# Patient Record
Sex: Female | Born: 1975
Health system: Southern US, Community
[De-identification: ages and names within clinical notes are randomized; demographics above are authoritative.]

## PROBLEM LIST (undated history)

## (undated) DIAGNOSIS — A64 Unspecified sexually transmitted disease: Secondary | ICD-10-CM

## (undated) DIAGNOSIS — F419 Anxiety disorder, unspecified: Secondary | ICD-10-CM

## (undated) DIAGNOSIS — Z8669 Personal history of other diseases of the nervous system and sense organs: Secondary | ICD-10-CM

## (undated) DIAGNOSIS — T7840XA Allergy, unspecified, initial encounter: Secondary | ICD-10-CM

## (undated) DIAGNOSIS — B009 Herpesviral infection, unspecified: Secondary | ICD-10-CM

## (undated) DIAGNOSIS — K5900 Constipation, unspecified: Secondary | ICD-10-CM

## (undated) DIAGNOSIS — L709 Acne, unspecified: Secondary | ICD-10-CM

## (undated) DIAGNOSIS — M1990 Unspecified osteoarthritis, unspecified site: Secondary | ICD-10-CM

## (undated) DIAGNOSIS — R1110 Vomiting, unspecified: Secondary | ICD-10-CM

## (undated) HISTORY — DX: Acne, unspecified: L70.9

## (undated) HISTORY — DX: Anxiety disorder, unspecified: F41.9

## (undated) HISTORY — DX: Herpesviral infection, unspecified: B00.9

## (undated) HISTORY — DX: Constipation, unspecified: K59.00

## (undated) HISTORY — PX: BREAST BIOPSY: SHX20

## (undated) HISTORY — PX: BUNIONECTOMY: SHX129

## (undated) HISTORY — PX: UMBILICAL HERNIA REPAIR: SHX196

## (undated) HISTORY — DX: Personal history of other diseases of the nervous system and sense organs: Z86.69

## (undated) HISTORY — DX: Vomiting, unspecified: R11.10

## (undated) HISTORY — DX: Allergy, unspecified, initial encounter: T78.40XA

## (undated) HISTORY — PX: OTHER SURGICAL HISTORY: SHX169

## (undated) HISTORY — DX: Unspecified osteoarthritis, unspecified site: M19.90

## (undated) HISTORY — DX: Unspecified sexually transmitted disease: A64

---

## 2010-01-11 ENCOUNTER — Ambulatory Visit: Payer: Self-pay | Admitting: Vascular Surgery

## 2010-01-11 ENCOUNTER — Ambulatory Visit (HOSPITAL_COMMUNITY): Admission: RE | Admit: 2010-01-11 | Discharge: 2010-01-11 | Payer: Self-pay | Admitting: Internal Medicine

## 2012-12-03 ENCOUNTER — Ambulatory Visit: Payer: Self-pay | Admitting: Family

## 2012-12-12 ENCOUNTER — Encounter: Payer: Self-pay | Admitting: Family

## 2012-12-12 ENCOUNTER — Ambulatory Visit (INDEPENDENT_AMBULATORY_CARE_PROVIDER_SITE_OTHER): Payer: PRIVATE HEALTH INSURANCE | Admitting: Family

## 2012-12-12 VITALS — BP 104/60 | HR 87 | Ht 67.5 in | Wt 188.0 lb

## 2012-12-12 DIAGNOSIS — Z23 Encounter for immunization: Secondary | ICD-10-CM

## 2012-12-12 DIAGNOSIS — G43909 Migraine, unspecified, not intractable, without status migrainosus: Secondary | ICD-10-CM

## 2012-12-12 MED ORDER — AMITRIPTYLINE HCL 10 MG PO TABS: 10.0000 mg | ORAL_TABLET | Freq: Every day | ORAL | Status: DC

## 2012-12-12 MED ORDER — SUMATRIPTAN SUCCINATE 100 MG PO TABS: 100.0000 mg | ORAL_TABLET | ORAL | Status: DC | PRN

## 2012-12-12 NOTE — Patient Instructions (Signed)
Migraine Headache A migraine headache is an intense, throbbing pain on one or both sides of your head. A migraine can last for 30 minutes to several hours. CAUSES  The exact cause of a migraine headache is not always known. However, a migraine may be caused when nerves in the brain become irritated and release chemicals that cause inflammation. This causes pain. SYMPTOMS  Pain on one or both sides of your head.  Pulsating or throbbing pain.  Severe pain that prevents daily activities.  Pain that is aggravated by any physical activity.  Nausea, vomiting, or both.  Dizziness.  Pain with exposure to bright lights, loud noises, or activity.  General sensitivity to bright lights, loud noises, or smells. Before you get a migraine, you may get warning signs that a migraine is coming (aura). An aura may include:  Seeing flashing lights.  Seeing bright spots, halos, or zig-zag lines.  Having tunnel vision or blurred vision.  Having feelings of numbness or tingling.  Having trouble talking.  Having muscle weakness. MIGRAINE TRIGGERS  Alcohol.  Smoking.  Stress.  Menstruation.  Aged cheeses.  Foods or drinks that contain nitrates, glutamate, aspartame, or tyramine.  Lack of sleep.  Chocolate.  Caffeine.  Hunger.  Physical exertion.  Fatigue.  Medicines used to treat chest pain (nitroglycerine), birth control pills, estrogen, and some blood pressure medicines. DIAGNOSIS  A migraine headache is often diagnosed based on:  Symptoms.  Physical examination.  A CT scan or MRI of your head. TREATMENT Medicines may be given for pain and nausea. Medicines can also be given to help prevent recurrent migraines.  HOME CARE INSTRUCTIONS  Only take over-the-counter or prescription medicines for pain or discomfort as directed by your caregiver. The use of long-term narcotics is not recommended.  Lie down in a dark, quiet room when you have a migraine.  Keep a journal  to find out what may trigger your migraine headaches. For example, write down:  What you eat and drink.  How much sleep you get.  Any change to your diet or medicines.  Limit alcohol consumption.  Quit smoking if you smoke.  Get 7 to 9 hours of sleep, or as recommended by your caregiver.  Limit stress.  Keep lights dim if bright lights bother you and make your migraines worse. SEEK IMMEDIATE MEDICAL CARE IF:   Your migraine becomes severe.  You have a fever.  You have a stiff neck.  You have vision loss.  You have muscular weakness or loss of muscle control.  You start losing your balance or have trouble walking.  You feel faint or pass out.  You have severe symptoms that are different from your first symptoms. MAKE SURE YOU:   Understand these instructions.  Will watch your condition.  Will get help right away if you are not doing well or get worse. Document Released: 03/13/2005 Document Revised: 06/05/2011 Document Reviewed: 03/03/2011 [employer] Patient Information 2014 [employer].  

## 2012-12-13 ENCOUNTER — Encounter: Payer: Self-pay | Admitting: Family

## 2012-12-13 DIAGNOSIS — G43909 Migraine, unspecified, not intractable, without status migrainosus: Secondary | ICD-10-CM | POA: Insufficient documentation

## 2012-12-13 NOTE — Progress Notes (Signed)
Subjective:    Patient ID: Jennifer Cardenas, female    DOB: 1975/07/01, 37 y.o.   MRN: [id number]  HPI  37 year old African American female, nonsmoker, the patient to the practice is in today with complaints of migraine headaches that typically occur around her menstrual cycle. Reports about 7 days of an actual migraine headache they're often debilitating in keeping her in bed. She takes Aleve then dulls the pain but says not completely free of the pain. Has nausea and vomiting associated with the headaches. At times, she has photophobia and phonophobia. Reports the headache being unilateral, one side or the other, one behind her ear down into her neck and shoulder. In the past, she has tried Maxalt and Fioricet that works sometimes.  Review of Systems  Constitutional: Negative.   HENT: Negative for congestion, rhinorrhea and sneezing.   Respiratory: Negative.   Cardiovascular: Negative.   Gastrointestinal: Negative.   Endocrine: Negative.   Genitourinary: Negative.   Musculoskeletal: Negative.   Skin: Negative.   Neurological: Positive for headaches. Negative for tremors and weakness.  Hematological: Negative.   Psychiatric/Behavioral: Negative.    Past Medical History  Diagnosis Date  . Herpes simplex     History   Social History  . Marital Status: Legally Separated    Spouse Name: N/A    Number of Children: N/A  . Years of Education: N/A   Occupational History  . Not on file.   Social History Main Topics  . Smoking status: Never Smoker   . Smokeless tobacco: Not on file  . Alcohol Use: No  . Drug Use: No  . Sexual Activity: Not on file   Other Topics Concern  . Not on file   Social History Narrative  . No narrative on file    Past Surgical History  Procedure Laterality Date  . Bunionectomy      1996 and 1998    Family History  Problem Relation Age of Onset  . Bipolar disorder Mother   . Hepatitis C Mother   . Hyperlipidemia Father     Allergies   Allergen Reactions  . Acne [Benzoyl Peroxide]     No current outpatient prescriptions on file prior to visit.   No current facility-administered medications on file prior to visit.    BP 104/60  Pulse 87  Ht 5' 7.5" (1.715 m)  Wt 188 lb (85.276 kg)  BMI 28.99 kg/m2chart    Objective:   Physical Exam  Constitutional: She is oriented to person, place, and time. She appears well-developed and well-nourished.  HENT:  Head: Normocephalic.  Right Ear: External ear normal.  Left Ear: External ear normal.  Nose: Nose normal.  Mouth/Throat: Oropharynx is clear and moist.  Eyes: Conjunctivae and EOM are normal. Pupils are equal, round, and reactive to light.  Neck: Normal range of motion. Neck supple. No thyromegaly present.  Cardiovascular: Normal rate, regular rhythm and normal heart sounds.   Pulmonary/Chest: Effort normal and breath sounds normal.  Abdominal: Soft. Bowel sounds are normal.  Musculoskeletal: Normal range of motion.  Neurological: She is alert and oriented to person, place, and time. She has normal reflexes. She displays normal reflexes. No cranial nerve deficit. Coordination normal.  Skin: Skin is warm and dry.  Psychiatric: She has a normal mood and affect.          Assessment & Plan:  Assessment: 1. Migraine headache  Plan: Amitriptyline 10 mg once daily at bedtime for migraine prevention. Imitrex 100 mg to be taken at  the onset of the headache may be repeated 2 hours later. Decrease caffeine intake. Exercise daily. Return for complete physical exam soon as possible. Call with any questions prior to then.

## 2012-12-17 ENCOUNTER — Other Ambulatory Visit (INDEPENDENT_AMBULATORY_CARE_PROVIDER_SITE_OTHER): Payer: PRIVATE HEALTH INSURANCE

## 2012-12-17 ENCOUNTER — Other Ambulatory Visit: Payer: PRIVATE HEALTH INSURANCE

## 2012-12-17 DIAGNOSIS — Z0000 Encounter for general adult medical examination without abnormal findings: Secondary | ICD-10-CM

## 2012-12-17 LAB — BASIC METABOLIC PANEL
BUN: 7 mg/dL (ref 6–23)
Calcium: 9.2 mg/dL (ref 8.4–10.5)
GFR: 108.85 mL/min (ref >60.00)
Glucose, Bld: 90 mg/dL (ref 70–99)

## 2012-12-17 LAB — LIPID PANEL
Cholesterol: 166 mg/dL (ref 0–200)
HDL: 44.7 mg/dL (ref >39.00)
Total CHOL/HDL Ratio: 4
Triglycerides: 80 mg/dL (ref 0.0–149.0)

## 2012-12-17 LAB — CBC WITH DIFFERENTIAL/PLATELET
Basophils Absolute: 0 10*3/uL (ref 0.0–0.1)
Eosinophils Absolute: 0 10*3/uL (ref 0.0–0.7)
Lymphocytes Relative: 19.6 % (ref 12.0–46.0)
Lymphs Abs: 1.6 10*3/uL (ref 0.7–4.0)
Monocytes Relative: 8.8 % (ref 3.0–12.0)
Platelets: 229 10*3/uL (ref 150.0–400.0)
RDW: 13.9 % (ref 11.5–14.6)

## 2012-12-17 LAB — POCT URINALYSIS DIPSTICK
Bilirubin, UA: NEGATIVE
Glucose, UA: NEGATIVE
Nitrite, UA: NEGATIVE

## 2012-12-17 LAB — TSH: TSH: 0.77 u[IU]/mL (ref 0.35–5.50)

## 2012-12-17 LAB — HEPATIC FUNCTION PANEL
AST: 16 U/L (ref 0–37)
Alkaline Phosphatase: 38 U/L — ABNORMAL LOW (ref 39–117)
Total Bilirubin: 0.6 mg/dL (ref 0.3–1.2)

## 2012-12-26 ENCOUNTER — Ambulatory Visit (INDEPENDENT_AMBULATORY_CARE_PROVIDER_SITE_OTHER): Payer: PRIVATE HEALTH INSURANCE | Admitting: Family

## 2012-12-26 ENCOUNTER — Encounter: Payer: Self-pay | Admitting: Family

## 2012-12-26 VITALS — BP 108/80 | HR 98 | Wt 192.0 lb

## 2012-12-26 DIAGNOSIS — Z1231 Encounter for screening mammogram for malignant neoplasm of breast: Secondary | ICD-10-CM

## 2012-12-26 DIAGNOSIS — Z803 Family history of malignant neoplasm of breast: Secondary | ICD-10-CM

## 2012-12-26 DIAGNOSIS — B009 Herpesviral infection, unspecified: Secondary | ICD-10-CM

## 2012-12-26 DIAGNOSIS — Z0000 Encounter for general adult medical examination without abnormal findings: Secondary | ICD-10-CM

## 2012-12-26 MED ORDER — VALACYCLOVIR HCL 1 G PO TABS: 1000.0000 mg | ORAL_TABLET | Freq: Two times a day (BID) | ORAL | Status: DC

## 2012-12-26 NOTE — Progress Notes (Signed)
Subjective:    Patient ID: Jennifer Cardenas, female    DOB: May 07, 1975, 37 y.o.   MRN: [id number]  HPI 37 year old AAF, in in for a routine physical examination for this healthy  Female. Reviewed all health maintenance protocols including mammography colonoscopy bone density and reviewed appropriate screening labs. Her immunization history was reviewed as well as her current medications and allergies refills of her chronic medications were given and the plan for yearly health maintenance was discussed all orders and referrals were made as appropriate.  Review of Systems  Constitutional: Negative.   HENT: Negative.   Eyes: Negative.   Respiratory: Negative.   Cardiovascular: Negative.   Gastrointestinal: Negative.   Endocrine: Negative.   Genitourinary: Negative.   Musculoskeletal: Negative.   Skin: Negative.   Allergic/Immunologic: Negative.   Neurological: Negative.   Hematological: Negative.   Psychiatric/Behavioral: Negative.    Past Medical History  Diagnosis Date  . Herpes simplex     History   Social History  . Marital Status: Legally Separated    Spouse Name: N/A    Number of Children: N/A  . Years of Education: N/A   Occupational History  . Not on file.   Social History Main Topics  . Smoking status: Never Smoker   . Smokeless tobacco: Not on file  . Alcohol Use: No  . Drug Use: No  . Sexual Activity: Not on file   Other Topics Concern  . Not on file   Social History Narrative  . No narrative on file    Past Surgical History  Procedure Laterality Date  . Bunionectomy      1996 and 1998    Family History  Problem Relation Age of Onset  . Bipolar disorder Mother   . Hepatitis C Mother   . Hyperlipidemia Father     Allergies  Allergen Reactions  . Acne [Benzoyl Peroxide]     Current Outpatient Prescriptions on File Prior to Visit  Medication Sig Dispense Refill  . amitriptyline (ELAVIL) 10 MG tablet Take 1 tablet (10 mg total) by mouth at  bedtime.  30 tablet  3  . SUMAtriptan (IMITREX) 100 MG tablet Take 1 tablet (100 mg total) by mouth every 2 (two) hours as needed for migraine. May repeat in 2 hours if headache persists or recurs.  10 tablet  3   No current facility-administered medications on file prior to visit.    BP 108/80  Pulse 98  Wt 192 lb (87.091 kg)  BMI 29.61 kg/m2chart    Objective:   Physical Exam  Constitutional: She is oriented to person, place, and time. She appears well-developed and well-nourished.  HENT:  Head: Normocephalic.  Right Ear: External ear normal.  Mouth/Throat: Oropharynx is clear and moist.  Eyes: Conjunctivae and EOM are normal. Pupils are equal, round, and reactive to light.  Neck: Normal range of motion. Neck supple. No thyromegaly present.  Cardiovascular: Normal rate, regular rhythm and normal heart sounds.   Pulmonary/Chest: Effort normal and breath sounds normal.  Abdominal: Soft. Bowel sounds are normal.  Musculoskeletal: Normal range of motion.  Neurological: She is alert and oriented to person, place, and time. She has normal reflexes. She displays normal reflexes. No cranial nerve deficit. Coordination normal.  Skin: Skin is warm and dry.  Psychiatric: She has a normal mood and affect.          Assessment & Plan:  Assessment: 1. CPX 2. Migraine Headache 3. Herpes Simplex  Plan: Continue current medications. Renew  Valtrex today. Encouraged healthy diet, exercise, monthly self breast exams. Due to her family history, mammogram ordered today. Will followup with patient in one year and sooner as needed. Refer to GYN for IUD insertion

## 2012-12-26 NOTE — Patient Instructions (Addendum)
Exercise to Stay Healthy Exercise helps you become and stay healthy. EXERCISE IDEAS AND TIPS Choose exercises that:  You enjoy.  Fit into your day. You do not need to exercise really hard to be healthy. You can do exercises at a slow or medium level and stay healthy. You can:  Stretch before and after working out.  Try yoga, Pilates, or tai chi.  Lift weights.  Walk fast, swim, jog, run, climb stairs, bicycle, dance, or rollerskate.  Take aerobic classes. Exercises that burn about 150 calories:  Running 1  miles in 15 minutes.  Playing volleyball for 45 to 60 minutes.  Washing and waxing a car for 45 to 60 minutes.  Playing touch football for 45 minutes.  Walking 1  miles in 35 minutes.  Pushing a stroller 1  miles in 30 minutes.  Playing basketball for 30 minutes.  Raking leaves for 30 minutes.  Bicycling 5 miles in 30 minutes.  Walking 2 miles in 30 minutes.  Dancing for 30 minutes.  Shoveling snow for 15 minutes.  Swimming laps for 20 minutes.  Walking up stairs for 15 minutes.  Bicycling 4 miles in 15 minutes.  Gardening for 30 to 45 minutes.  Jumping rope for 15 minutes.  Washing windows or floors for 45 to 60 minutes. Document Released: 04/15/2010 Document Revised: 06/05/2011 Document Reviewed: 04/15/2010 [employer] Patient Information 2014 [employer].  

## 2012-12-30 ENCOUNTER — Telehealth: Payer: Self-pay | Admitting: Gynecology

## 2012-12-30 NOTE — Telephone Encounter (Signed)
Called and left message for patient to call me about a doctor referral.

## 2012-12-31 ENCOUNTER — Telehealth: Payer: Self-pay | Admitting: Family

## 2012-12-31 NOTE — Telephone Encounter (Signed)
Spoke with pt

## 2012-12-31 NOTE — Telephone Encounter (Signed)
Pt returning your call

## 2013-01-01 ENCOUNTER — Encounter: Payer: Self-pay | Admitting: Obstetrics and Gynecology

## 2013-01-01 ENCOUNTER — Ambulatory Visit (INDEPENDENT_AMBULATORY_CARE_PROVIDER_SITE_OTHER): Payer: PRIVATE HEALTH INSURANCE | Admitting: Obstetrics and Gynecology

## 2013-01-01 VITALS — BP 122/78 | HR 82 | Resp 18 | Ht 68.5 in | Wt 191.0 lb

## 2013-01-01 DIAGNOSIS — Z0000 Encounter for general adult medical examination without abnormal findings: Secondary | ICD-10-CM

## 2013-01-01 DIAGNOSIS — Z3009 Encounter for other general counseling and advice on contraception: Secondary | ICD-10-CM

## 2013-01-01 DIAGNOSIS — Z01419 Encounter for gynecological examination (general) (routine) without abnormal findings: Secondary | ICD-10-CM

## 2013-01-01 NOTE — Progress Notes (Signed)
GYNECOLOGY VISIT  PCP: Maylon Peppers, FNP ( [facility])  Referring provider: [facility]   HPI: 37 y.o.  Divorced  [place]  female   [phone] with Patient's last menstrual period was 11/08/2012.   here for NGYN.Annual Exam, Discuss removal of Implanon and other forms of contraception  Has migraines around menstrual time, without aura.   Taking amitriptyline to control headaches, but makes her sleepy so cannot take it when she works. Will have difficulty taking a pill every day due to work. Used OCPS in past and had menstrual headaches, but this was years ago. Patient uncertain if pills had estrogen in her menstrual week. Also wants to improve acne.   Hgb:  pcp Urine:  pcp  GYNECOLOGIC HISTORY: Patient's last menstrual period was 11/08/2012. Sexually active: no, not for 2 - 3 years.  Partner preference: female Contraception:   Implanon due to be removed 11/2012 Menopausal hormone therapy: none  DES exposure:   no Blood transfusions:   no Sexually transmitted diseases:    GYN Procedures:  Colposcopy did not do a biopsy.  No treatment.  Mammogram:      never           Pap:   2012 neg History of abnormal pap smear:  2000   OB History   Grav Para Term Preterm Abortions TAB SAB Ect Mult Living   2 2 2       2        LIFESTYLE: Exercise:      None listed          Tobacco: No Alcohol: No Drug use:  No  OTHER HEALTH MAINTENANCE: Tetanus/TDap:  2013 Gardisil:  NA Influenza:  September 2014.  Zostavax:  NA  Bone density: NA Colonoscopy:  NA  Cholesterol check:   Family History  Problem Relation Age of Onset  . Bipolar disorder Mother   . Hepatitis C Mother     Patient Active Problem List   Diagnosis Date Noted  . Herpes simplex 12/26/2012  . Migraine headache 12/13/2012   Past Medical History  Diagnosis Date  . Herpes simplex     H/O HSV 1   and on leg  . STD (sexually transmitted disease)     HSV 1  . Hx of migraines   . Acne   .  Hyperemesis     Past Surgical History  Procedure Laterality Date  . Bunionectomy Bilateral     1996 and 1998    ALLERGIES: Acne  Current Outpatient Prescriptions  Medication Sig Dispense Refill  . amitriptyline (ELAVIL) 10 MG tablet Take 1 tablet (10 mg total) by mouth at bedtime.  30 tablet  3  . SUMAtriptan (IMITREX) 100 MG tablet Take 1 tablet (100 mg total) by mouth every 2 (two) hours as needed for migraine. May repeat in 2 hours if headache persists or recurs.  10 tablet  3  . valACYclovir (VALTREX) 1000 MG tablet Take 1 tablet (1,000 mg total) by mouth 2 (two) times daily.  20 tablet  3   No current facility-administered medications for this visit.     ROS:  Pertinent items are noted in HPI.  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   PHYSICAL EXAMINATION:    BP 122/78  Pulse 82  Resp 18  Ht 5' 8.5" (1.74 m)  Wt 191 lb (86.637 kg)  BMI 28.62 kg/m2  LMP 11/08/2012   Wt Readings from Last 3 Encounters:  01/01/13 191 lb (86.637 kg)  12/26/12 192 lb (87.091 kg)  12/12/12 188 lb (85.276 kg)     Ht Readings from Last 3 Encounters:  01/01/13 5' 8.5" (1.74 m)  12/12/12 5' 7.5" (1.715 m)    General appearance: alert, cooperative and appears stated age Head: Normocephalic, without obvious abnormality, atraumatic Neck: no adenopathy, supple, symmetrical, trachea midline and thyroid not enlarged, symmetric, no tenderness/mass/nodules Lungs: clear to auscultation bilaterally Breasts: Inspection negative, No nipple retraction or dimpling, No nipple discharge or bleeding, No axillary or supraclavicular adenopathy, Normal to palpation without dominant masses Heart: regular rate and rhythm Abdomen: soft, non-tender; no masses,  no organomegaly Extremities: extremities normal, atraumatic, no cyanosis or edema Skin: Skin color, texture, turgor normal. No rashes or lesions.  Implanon palpable in the right arm.  Raised small 3 mm dark firm scars of the right arm.    Lymph  nodes: Cervical, supraclavicular, and axillary nodes normal. No abnormal inguinal nodes palpated Neurologic: Grossly normal  Pelvic: External genitalia:  no lesions              Urethra:  normal appearing urethra with no masses, tenderness or lesions              Bartholins and Skenes: normal                 Vagina: normal appearing vagina with normal color and discharge, no lesions              Cervix: normal appearance              Pap and high risk HPV testing done: yes.            Bimanual Exam:  Uterus:  uterus is normal size, shape, consistency and nontender                                      Adnexa: normal adnexa in size, nontender and no masses                                      Rectovaginal: Confirms                                      Anus:  normal sphincter tone, no lesions  ASSESSMENT  Normal gynecologic exam. Implanon patient. History of menstrual migraines.   PLAN  Mammogram as pere PCP orders. Pap smear and high risk HPV testing Counseled on Nuvaring continuous use.  Can initiate when her Implanon comes out.  Return for Implanon removal.   Return annually or prn   An After Visit Summary was printed and given to the patient.

## 2013-01-01 NOTE — Patient Instructions (Signed)
EXERCISE AND DIET:  We recommended that you start or continue a regular exercise program for good health. Regular exercise means any activity that makes your heart beat faster and makes you sweat.  We recommend exercising at least 30 minutes per day at least 3 days a week, preferably 4 or 5.  We also recommend a diet low in fat and sugar.  Inactivity, poor dietary choices and obesity can cause diabetes, heart attack, stroke, and kidney damage, among others.    ALCOHOL AND SMOKING:  Women should limit their alcohol intake to no more than 7 drinks/beers/glasses of wine (combined, not each!) per week. Moderation of alcohol intake to this level decreases your risk of breast cancer and liver damage. And of course, no recreational drugs are part of a healthy lifestyle.  And absolutely no smoking or even second hand smoke. Most people know smoking can cause heart and lung diseases, but did you know it also contributes to weakening of your bones? Aging of your skin?  Yellowing of your teeth and nails?  CALCIUM AND VITAMIN D:  Adequate intake of calcium and Vitamin D are recommended.  The recommendations for exact amounts of these supplements seem to change often, but generally speaking 600 mg of calcium (either carbonate or citrate) and 800 units of Vitamin D per day seems prudent. Certain women may benefit from higher intake of Vitamin D.  If you are among these women, your doctor will have told you during your visit.    PAP SMEARS:  Pap smears, to check for cervical cancer or precancers,  have traditionally been done yearly, although recent scientific advances have shown that most women can have pap smears less often.  However, every woman still should have a physical exam from her gynecologist every year. It will include a breast check, inspection of the vulva and vagina to check for abnormal growths or skin changes, a visual exam of the cervix, and then an exam to evaluate the size and shape of the uterus and  ovaries.  And after 40 years of age, a rectal exam is indicated to check for rectal cancers. We will also provide age appropriate advice regarding health maintenance, like when you should have certain vaccines, screening for sexually transmitted diseases, bone density testing, colonoscopy, mammograms, etc.   MAMMOGRAMS:  All women over 40 years old should have a yearly mammogram. Many facilities now offer a "3D" mammogram, which may cost around $50 extra out of pocket. If possible,  we recommend you accept the option to have the 3D mammogram performed.  It both reduces the number of women who will be called back for extra views which then turn out to be normal, and it is better than the routine mammogram at detecting truly abnormal areas.    COLONOSCOPY:  Colonoscopy to screen for colon cancer is recommended for all women at age 50.  We know, you hate the idea of the prep.  We agree, BUT, having colon cancer and not knowing it is worse!!  Colon cancer so often starts as a polyp that can be seen and removed at colonscopy, which can quite literally save your life!  And if your first colonoscopy is normal and you have no family history of colon cancer, most women don't have to have it again for 10 years.  Once every ten years, you can do something that may end up saving your life, right?  We will be happy to help you get it scheduled when you are ready.    Be sure to check your insurance coverage so you understand how much it will cost.  It may be covered as a preventative service at no cost, but you should check your particular policy.     Ethinyl Estradiol; Etonogestrel vaginal ring What is this medicine? ETHINYL ESTRADIOL; ETONOGESTREL (ETH in il es tra DYE ole; et oh noe JES trel) vaginal ring is a flexible, vaginal ring used as a contraceptive (birth control method). This medicine combines two types of female hormones, an estrogen and a progestin. This ring is used to prevent ovulation and pregnancy. Each  ring is effective for one month. This medicine may be used for other purposes; ask your health care provider or pharmacist if you have questions. What should I tell my health care provider before I take this medicine? They need to know if you have or ever had any of these conditions: -abnormal vaginal bleeding -blood vessel disease or blood clots -breast, cervical, endometrial, ovarian, liver, or uterine cancer -diabetes -gallbladder disease -heart disease or recent heart attack -high blood pressure -high cholesterol -kidney disease -liver disease -migraine headaches -stroke -systemic lupus erythematosus (SLE) -tobacco smoker -an unusual or allergic reaction to estrogens, progestins, other medicines, foods, dyes, or preservatives -pregnant or trying to get pregnant -breast-feeding How should I use this medicine? Insert the ring into your vagina as directed. Follow the directions on the prescription label. The ring will remain place for 3 weeks and is then removed for a 1-week break. A new ring is inserted 1 week after the last ring was removed, on the same day of the week. Do not use more often than directed. A patient package insert for the product will be given with each prescription and refill. Read this sheet carefully each time. The sheet may change frequently. Contact your pediatrician regarding the use of this medicine in children. Special care may be needed. This medicine has been used in female children who have started having menstrual periods. Overdosage: If you think you have taken too much of this medicine contact a poison control center or emergency room at once. NOTE: This medicine is only for you. Do not share this medicine with others. What if I miss a dose? You will need to replace your vaginal ring once a month as directed. If the ring should slip out, or if you leave it in longer or shorter than you should, contact your health care professional for advice. What may  interact with this medicine? -acetaminophen -antibiotics or medicines for infections, especially rifampin, rifabutin, rifapentine, and griseofulvin, and possibly penicillins or tetracyclines -aprepitant -ascorbic acid (vitamin C) -atorvastatin -barbiturate medicines, such as phenobarbital -bosentan -carbamazepine -caffeine -clofibrate -cyclosporine -dantrolene -doxercalciferol -felbamate -grapefruit juice -hydrocortisone -medicines for anxiety or sleeping problems, such as diazepam or temazepam -medicines for diabetes, including pioglitazone -modafinil -mycophenolate -nefazodone -oxcarbazepine -phenytoin -prednisolone -ritonavir or other medicines for HIV infection or AIDS -rosuvastatin -selegiline -soy isoflavones supplements -St. John's wort -tamoxifen or raloxifene -theophylline -thyroid hormones -topiramate -warfarin This list may not describe all possible interactions. Give your health care provider a list of all the medicines, herbs, non-prescription drugs, or dietary supplements you use. Also tell them if you smoke, drink alcohol, or use illegal drugs. Some items may interact with your medicine. What should I watch for while using this medicine? Visit your doctor or health care professional for regular checks on your progress. You will need a regular breast and pelvic exam and Pap smear while on this medicine. Use an additional method of contraception   during the first cycle that you use this ring. If you have any reason to think you are pregnant, stop using this medicine right away and contact your doctor or health care professional. If you are using this medicine for hormone related problems, it may take several cycles of use to see improvement in your condition. Smoking increases the risk of getting a blood clot or having a stroke while you are using hormonal birth control, especially if you are more than 39 years old. You are strongly advised not to smoke. This  medicine can make your body retain fluid, making your fingers, hands, or ankles swell. Your blood pressure can go up. Contact your doctor or health care professional if you feel you are retaining fluid. This medicine can make you more sensitive to the sun. Keep out of the sun. If you cannot avoid being in the sun, wear protective clothing and use sunscreen. Do not use sun lamps or tanning beds/booths. If you wear contact lenses and notice visual changes, or if the lenses begin to feel uncomfortable, consult your eye care specialist. In some women, tenderness, swelling, or minor bleeding of the gums may occur. Notify your dentist if this happens. Brushing and flossing your teeth regularly may help limit this. See your dentist regularly and inform your dentist of the medicines you are taking. If you are going to have elective surgery, you may need to stop using this medicine before the surgery. Consult your health care professional for advice. This medicine does not protect you against HIV infection (AIDS) or any other sexually transmitted diseases. What side effects may I notice from receiving this medicine? Side effects that you should report to your doctor or health care professional as soon as possible: -breast tissue changes or discharge -changes in vaginal bleeding during your period or between your periods -chest pain -coughing up blood -dizziness or fainting spells -headaches or migraines -leg, arm or groin pain -severe or sudden headaches -stomach pain (severe) -sudden shortness of breath -sudden loss of coordination, especially on one side of the body -speech problems -symptoms of vaginal infection like itching, irritation or unusual discharge -tenderness in the upper abdomen -vomiting -weakness or numbness in the arms or legs, especially on one side of the body -yellowing of the eyes or skin Side effects that usually do not require medical attention (report to your doctor or health  care professional if they continue or are bothersome): -breakthrough bleeding and spotting that continues beyond the 3 initial cycles of pills -breast tenderness -mood changes, anxiety, depression, frustration, anger, or emotional outbursts -increased sensitivity to sun or ultraviolet light -nausea -skin rash, acne, or brown spots on the skin -weight gain (slight) This list may not describe all possible side effects. Call your doctor for medical advice about side effects. You may report side effects to FDA at 1-800-FDA-1088. Where should I keep my medicine? Keep out of the reach of children. Store at room temperature between 15 and 30 degrees C (59 and 86 degrees F) for up to 4 months. The product will expire after 4 months. Protect from light. Throw away any unused medicine after the expiration date. NOTE: This sheet is a summary. It may not cover all possible information. If you have questions about this medicine, talk to your doctor, pharmacist, or health care provider.  2013, [employer]. (02/27/2008 12:03:58 PM)

## 2013-01-02 NOTE — Addendum Note (Signed)
Addended by: Conley Simmonds on: 01/02/2013 07:17 AM   Modules accepted: Orders

## 2013-01-07 ENCOUNTER — Telehealth: Payer: Self-pay | Admitting: Obstetrics and Gynecology

## 2013-01-07 NOTE — Telephone Encounter (Signed)
LMTCB when ready to schedule Nexplanon removal. Advised we received a quote from [employer] that it will be covered 100%, advised this is NOT a guarantee of coverage or payment and to call to schedule.

## 2013-01-14 NOTE — Telephone Encounter (Signed)
Patient has implanon. Requesting dc.  Message left to return call to Lazear at [phone].

## 2013-01-14 NOTE — Telephone Encounter (Signed)
Patient requesting removal of implanon.  Appointment scheduled for 11/3 at 1130.  Routing to Dr. Edward Jolly for FYI. Will close encounter.

## 2013-01-14 NOTE — Telephone Encounter (Signed)
Pt wants to have her IUD removed. 

## 2013-01-15 ENCOUNTER — Other Ambulatory Visit: Payer: Self-pay | Admitting: Obstetrics and Gynecology

## 2013-01-15 DIAGNOSIS — Z3040 Encounter for surveillance of contraceptives, unspecified: Secondary | ICD-10-CM

## 2013-01-27 ENCOUNTER — Encounter: Payer: Self-pay | Admitting: Obstetrics and Gynecology

## 2013-01-27 ENCOUNTER — Ambulatory Visit (INDEPENDENT_AMBULATORY_CARE_PROVIDER_SITE_OTHER): Payer: PRIVATE HEALTH INSURANCE | Admitting: Obstetrics and Gynecology

## 2013-01-27 VITALS — BP 110/66 | HR 82 | Ht 68.5 in | Wt 196.0 lb

## 2013-01-27 DIAGNOSIS — Z3040 Encounter for surveillance of contraceptives, unspecified: Secondary | ICD-10-CM

## 2013-01-27 DIAGNOSIS — Z3046 Encounter for surveillance of implantable subdermal contraceptive: Secondary | ICD-10-CM

## 2013-01-27 MED ORDER — ETONOGESTREL-ETHINYL ESTRADIOL 0.12-0.015 MG/24HR VA RING: VAGINAL_RING | VAGINAL | Status: DC

## 2013-01-27 NOTE — Patient Instructions (Signed)

## 2013-01-27 NOTE — Progress Notes (Signed)
Patient ID: Jennifer Cardenas, female   DOB: Jul 10, 1975, 37 y.o.   MRN: [id number]  Subjective  Here for Implanon removal. Can have menstrual headaches, not every month.   Has migraines.  No aura.  Not a smoker.  No history of DVT, PE.   Will start working at the [employer] in December.   Objective  Right arm examine and Implanon rod located. Consent for removal.  Sterile prep of skin with betadine.   1% lidocaine local. Incision with scalpel over the entrance site of the Implanon. Grasped with a hemostat and removed without difficulty. Steristrip, benzoin, and bandaid placed. No complications. Minimal EBL.   Assessment  Removal of Implanon. Menstrual migraines, but not consistent every month.   Plan  Post Implanon removal care reviewed with the patient. Start NuvaRing.  Will use continuously for 3 months and then have a cycle.  Reviewed the proper use of NuvaRing.  Follow up prn.

## 2013-02-03 ENCOUNTER — Other Ambulatory Visit: Payer: Self-pay | Admitting: Obstetrics and Gynecology

## 2013-02-03 ENCOUNTER — Encounter: Payer: Self-pay | Admitting: Obstetrics and Gynecology

## 2013-02-07 ENCOUNTER — Ambulatory Visit
Admission: RE | Admit: 2013-02-07 | Discharge: 2013-02-07 | Disposition: A | Discharge status: Home / Self Care | Payer: PRIVATE HEALTH INSURANCE | Source: Ambulatory Visit | Attending: Family | Admitting: Family

## 2013-02-07 DIAGNOSIS — Z1231 Encounter for screening mammogram for malignant neoplasm of breast: Secondary | ICD-10-CM

## 2013-02-07 DIAGNOSIS — Z803 Family history of malignant neoplasm of breast: Secondary | ICD-10-CM

## 2013-02-10 ENCOUNTER — Encounter: Payer: Self-pay | Admitting: Family

## 2013-02-11 ENCOUNTER — Other Ambulatory Visit: Payer: Self-pay

## 2013-02-11 MED ORDER — AMITRIPTYLINE HCL 10 MG PO TABS: 20.0000 mg | ORAL_TABLET | Freq: Every day | ORAL | Status: DC

## 2013-02-11 NOTE — Telephone Encounter (Signed)
Pt still experiencing headaches. Per Padonda, increase Elavil to 20mg  qhs

## 2013-03-14 ENCOUNTER — Telehealth: Payer: Self-pay | Admitting: Family

## 2013-03-14 NOTE — Telephone Encounter (Signed)
Attempted call back x 1. LM on VM to call back .

## 2013-04-14 ENCOUNTER — Encounter: Payer: Self-pay | Admitting: Family

## 2013-04-15 ENCOUNTER — Encounter: Payer: Self-pay | Admitting: Family

## 2013-04-15 ENCOUNTER — Ambulatory Visit (INDEPENDENT_AMBULATORY_CARE_PROVIDER_SITE_OTHER): Payer: 59 | Admitting: Family

## 2013-04-15 VITALS — BP 104/60 | HR 102 | Ht 68.5 in | Wt 196.0 lb

## 2013-04-15 DIAGNOSIS — M25569 Pain in unspecified knee: Secondary | ICD-10-CM

## 2013-04-15 DIAGNOSIS — M25562 Pain in left knee: Secondary | ICD-10-CM

## 2013-04-15 DIAGNOSIS — M7052 Other bursitis of knee, left knee: Secondary | ICD-10-CM

## 2013-04-15 DIAGNOSIS — M76899 Other specified enthesopathies of unspecified lower limb, excluding foot: Secondary | ICD-10-CM

## 2013-04-15 MED ORDER — MELOXICAM 15 MG PO TABS: 15.0000 mg | ORAL_TABLET | Freq: Every day | ORAL | Status: DC

## 2013-04-15 NOTE — Progress Notes (Signed)
Subjective:    Patient ID: Jennifer Cardenas, female    DOB: May 01, 1975, 38 y.o.   MRN: [id number]  HPI 38 year old A[employer], nonsmoker, is in today with c/o left knee pain x 3 weeks. Reports a twist injury. Pain is worse with sitting and standing for an extended period of time. Rates the pain 8-9/10. Has taken Aleve without much relief. She is employed as a Therapist, sports.   Review of Systems  Constitutional: Negative.   Respiratory: Negative.   Cardiovascular: Negative.   Musculoskeletal: Positive for arthralgias.       Left knee pain  Skin: Negative.   Neurological: Negative.        Past Medical History  Diagnosis Date  . Herpes simplex     H/O HSV 1   and on leg  . STD (sexually transmitted disease)     HSV 1  . Hx of migraines   . Acne   . Hyperemesis     History   Social History  . Marital Status: Legally Separated    Spouse Name: N/A    Number of Children: N/A  . Years of Education: N/A   Occupational History  . Not on file.   Social History Main Topics  . Smoking status: Never Smoker   . Smokeless tobacco: Never Used  . Alcohol Use: No  . Drug Use: No  . Sexual Activity: No     Comment: Implanon due to be removed 11/2012   Other Topics Concern  . Not on file   Social History Narrative  . No narrative on file    Past Surgical History  Procedure Laterality Date  . Bunionectomy Bilateral     1996 and 1998  . Excision of right breast mass Right     benign    Family History  Problem Relation Age of Onset  . Bipolar disorder Mother   . Hepatitis C Mother     Allergies  Allergen Reactions  . Acne [Benzoyl Peroxide]     Current Outpatient Prescriptions on File Prior to Visit  Medication Sig Dispense Refill  . amitriptyline (ELAVIL) 10 MG tablet Take 2 tablets (20 mg total) by mouth at bedtime.  60 tablet  0  . etonogestrel-ethinyl estradiol (NUVARING) 0.12-0.015 MG/24HR vaginal ring Insert vaginally and leave in place for 3 consecutive weeks, then remove for 1  week.  3 each  3  . SUMAtriptan (IMITREX) 100 MG tablet Take 1 tablet (100 mg total) by mouth every 2 (two) hours as needed for migraine. May repeat in 2 hours if headache persists or recurs.  10 tablet  3  . valACYclovir (VALTREX) 1000 MG tablet Take 1 tablet (1,000 mg total) by mouth 2 (two) times daily.  20 tablet  3   No current facility-administered medications on file prior to visit.    BP 104/60  Pulse 102  Ht 5' 8.5" (1.74 m)  Wt 196 lb (88.905 kg)  BMI 29.36 kg/m2chart Objective:   Physical Exam  Constitutional: She is oriented to person, place, and time. She appears well-developed and well-nourished.  Cardiovascular: Normal rate, regular rhythm and normal heart sounds.   Pulmonary/Chest: Effort normal and breath sounds normal.  Musculoskeletal: She exhibits edema and tenderness.  Left knee: non tender to palpation. Mild edema. NO crepitus. No calf tenderness.   Neurological: She is alert and oriented to person, place, and time.  Skin: Skin is warm and dry.  Psychiatric: She has a normal mood and affect.  Assessment & Plan:  Assessment:  1. Knee pain-Left 2. Left knee bursitis-Mobic once daily. Ice 15-20 min daily. Call if symptoms worsen or persist. Recheck as needed. Consider joint injection if no better.

## 2013-04-15 NOTE — Patient Instructions (Signed)
Bursitis Bursitis is a swelling and soreness (inflammation) of a fluid-filled sac (bursa) that overlies and protects a joint. It can be caused by injury, overuse of the joint, arthritis or infection. The joints most likely to be affected are the elbows, shoulders, hips and knees. HOME CARE INSTRUCTIONS   Apply ice to the affected area for 15-20 minutes each hour while awake for 2 days. Put the ice in a plastic bag and place a towel between the bag of ice and your skin.  Rest the injured joint as much as possible, but continue to put the joint through a full range of motion, 4 times per day. (The shoulder joint especially becomes rapidly "frozen" if not used.) When the pain lessens, begin normal slow movements and usual activities.  Only take over-the-counter or prescription medicines for pain, discomfort or fever as directed by your caregiver.  Your caregiver may recommend draining the bursa and injecting medicine into the bursa. This may help the healing process.  Follow all instructions for follow-up with your caregiver. This includes any orthopedic referrals, physical therapy and rehabilitation. Any delay in obtaining necessary care could result in a delay or failure of the bursitis to heal and chronic pain. SEEK IMMEDIATE MEDICAL CARE IF:   Your pain increases even during treatment.  You develop an oral temperature above 102 F (38.9 C) and have heat and inflammation over the involved bursa. MAKE SURE YOU:   Understand these instructions.  Will watch your condition.  Will get help right away if you are not doing well or get worse. Document Released: 03/10/2000 Document Revised: 06/05/2011 Document Reviewed: 02/12/2009 Exit[employer] Patient Information 2014 [employer].  

## 2013-04-18 ENCOUNTER — Encounter: Payer: Self-pay | Admitting: Family

## 2013-04-18 ENCOUNTER — Other Ambulatory Visit: Payer: Self-pay | Admitting: Obstetrics and Gynecology

## 2013-04-18 ENCOUNTER — Encounter: Payer: Self-pay | Admitting: Obstetrics and Gynecology

## 2013-04-18 MED ORDER — VALACYCLOVIR HCL 1 G PO TABS: 1000.0000 mg | ORAL_TABLET | Freq: Two times a day (BID) | ORAL | Status: DC

## 2013-04-18 MED ORDER — SUMATRIPTAN SUCCINATE 100 MG PO TABS: 100.0000 mg | ORAL_TABLET | ORAL | Status: DC | PRN

## 2013-04-18 MED ORDER — ETONOGESTREL-ETHINYL ESTRADIOL 0.12-0.015 MG/24HR VA RING: VAGINAL_RING | VAGINAL | Status: DC

## 2013-04-18 MED ORDER — AMITRIPTYLINE HCL 10 MG PO TABS: 20.0000 mg | ORAL_TABLET | Freq: Every day | ORAL | Status: DC

## 2013-05-23 ENCOUNTER — Encounter: Payer: Self-pay | Admitting: Family

## 2013-05-26 ENCOUNTER — Encounter: Payer: Self-pay | Admitting: Family

## 2013-05-26 ENCOUNTER — Ambulatory Visit (INDEPENDENT_AMBULATORY_CARE_PROVIDER_SITE_OTHER): Payer: 59 | Admitting: Family

## 2013-05-26 VITALS — BP 102/82 | Temp 98.7°F | Wt 202.0 lb

## 2013-05-26 DIAGNOSIS — G43909 Migraine, unspecified, not intractable, without status migrainosus: Secondary | ICD-10-CM

## 2013-05-26 DIAGNOSIS — M1712 Unilateral primary osteoarthritis, left knee: Secondary | ICD-10-CM

## 2013-05-26 DIAGNOSIS — M1710 Unilateral primary osteoarthritis, unspecified knee: Secondary | ICD-10-CM

## 2013-05-26 DIAGNOSIS — IMO0002 Reserved for concepts with insufficient information to code with codable children: Secondary | ICD-10-CM

## 2013-05-26 DIAGNOSIS — M1711 Unilateral primary osteoarthritis, right knee: Secondary | ICD-10-CM

## 2013-05-26 MED ORDER — AMITRIPTYLINE HCL 25 MG PO TABS: 25.0000 mg | ORAL_TABLET | Freq: Every day | ORAL | Status: DC

## 2013-05-26 MED ORDER — METHYLPREDNISOLONE ACETATE 40 MG/ML IJ SUSP: 40.0000 mg | Freq: Once | INTRAMUSCULAR | Status: DC

## 2013-05-26 MED ORDER — MELOXICAM 15 MG PO TABS: 15.0000 mg | ORAL_TABLET | Freq: Every day | ORAL | Status: DC

## 2013-05-26 NOTE — Patient Instructions (Signed)
Knee Injection Joint injections are shots. Your caregiver will place a needle into your knee joint. The needle is used to put medicine into the joint. These shots can be used to help treat different painful knee conditions such as osteoarthritis, bursitis, local flare-ups of rheumatoid arthritis, and pseudogout. Anti-inflammatory medicines such as corticosteroids and anesthetics are the most common medicines used for joint and soft tissue injections.  PROCEDURE  The skin over the kneecap will be cleaned with an antiseptic solution.  Your caregiver will inject a small amount of a local anesthetic (a medicine like Novocaine) just under the skin in the area that was cleaned.  After the area becomes numb, a second injection is done. This second injection usually includes an anesthetic and an anti-inflammatory medicine called a steroid or cortisone. The needle is carefully placed in between the kneecap and the knee, and the medicine is injected into the joint space.  After the injection is done, the needle is removed. Your caregiver may place a bandage over the injection site. The whole procedure takes no more than a couple of minutes. BEFORE THE PROCEDURE  Wash all of the skin around the entire knee area. Try to remove any loose, scaling skin. There is no other specific preparation necessary unless advised otherwise by your caregiver. LET YOUR CAREGIVER KNOW ABOUT:   Allergies.  Medications taken including herbs, eye drops, over the counter medications, and creams.  Use of steroids (by mouth or creams).  Possible pregnancy, if applicable.  Previous problems with anesthetics or Novocaine.  History of blood clots (thrombophlebitis).  History of bleeding or blood problems.  Previous surgery.  Other health problems. RISKS AND COMPLICATIONS Side effects from cortisone shots are rare. They include:   Slight bruising of the skin.  Shrinkage of the normal fatty tissue under the skin where  the shot was given.  Increase in pain after the shot.  Infection.  Weakening of tendons or tendon rupture.  Allergic reaction to the medicine.  Diabetics may have a temporary increase in their blood sugar after a shot.  Cortisone can temporarily weaken the immune system. While receiving these shots, you should not get certain vaccines. Also, avoid contact with anyone who has chickenpox or measles. Especially if you have never had these diseases or have not been previously immunized. Your immune system may not be strong enough to fight off the infection while the cortisone is in your system. AFTER THE PROCEDURE   You can go home after the procedure.  You may need to put ice on the joint 15-20 minutes every 3 or 4 hours until the pain goes away.  You may need to put an elastic bandage on the joint. HOME CARE INSTRUCTIONS   Only take over-the-counter or prescription medicines for pain, discomfort, or fever as directed by your caregiver.  You should avoid stressing the joint. Unless advised otherwise, avoid activities that put a lot of pressure on a knee joint, such as:  Jogging.  Bicycling.  Recreational climbing.  Hiking.  Laying down and elevating the leg/knee above the level of your heart can help to minimize swelling. SEEK MEDICAL CARE IF:   You have repeated or worsening swelling.  There is drainage from the puncture area.  You develop red streaking that extends above or below the site where the needle was inserted. SEEK IMMEDIATE MEDICAL CARE IF:   You develop a fever.  You have pain that gets worse even though you are taking pain medicine.  The area is   red and warm, and you have trouble moving the joint. MAKE SURE YOU:   Understand these instructions.  Will watch your condition.  Will get help right away if you are not doing well or get worse. Document Released: 06/04/2006 Document Revised: 06/05/2011 Document Reviewed: 03/01/2007 Exit[employer] Patient  Information 2014 [employer].  

## 2013-05-26 NOTE — Progress Notes (Signed)
Pre visit review using our clinic review tool, if applicable. No additional management support is needed unless otherwise documented below in the visit note. 

## 2013-05-26 NOTE — Progress Notes (Signed)
Subjective:    Patient ID: Jennifer Cardenas, female    DOB: 06/11/1975, 38 y.o.   MRN: [id number]  HPI Comments: 38 year old African American female, nonsmoker comes in today with bilateral knee pain from osteoarthritis. She is requesting a joint injection. The pain is 6/10, worse with walking and standing. She works as an Therapist, sports.   Patient also has a history of migraine headaches and is currently on amitriptyline 20 mg at bedtime that has decreased the intensity and frequency of her headaches. However, still had a headache approximately once a week.       Review of Systems  Constitutional: Negative.   Respiratory: Negative.   Cardiovascular: Negative.   Musculoskeletal: Positive for arthralgias.       Bilateral knee pain  Skin: Negative.   Neurological: Positive for headaches.       Migraine headaches, decreased intensity and frequency  Psychiatric/Behavioral: Negative.    Past Medical History  Diagnosis Date  . Herpes simplex     H/O HSV 1   and on leg  . STD (sexually transmitted disease)     HSV 1  . Hx of migraines   . Acne   . Hyperemesis     History   Social History  . Marital Status: Legally Separated    Spouse Name: N/A    Number of Children: N/A  . Years of Education: N/A   Occupational History  . Not on file.   Social History Main Topics  . Smoking status: Never Smoker   . Smokeless tobacco: Never Used  . Alcohol Use: No  . Drug Use: No  . Sexual Activity: No     Comment: Implanon due to be removed 11/2012   Other Topics Concern  . Not on file   Social History Narrative  . No narrative on file    Past Surgical History  Procedure Laterality Date  . Bunionectomy Bilateral     1996 and 1998  . Excision of right breast mass Right     benign    Family History  Problem Relation Age of Onset  . Bipolar disorder Mother   . Hepatitis C Mother     Allergies  Allergen Reactions  . Acne [Benzoyl Peroxide]     Current Outpatient Prescriptions on  File Prior to Visit  Medication Sig Dispense Refill  . etonogestrel-ethinyl estradiol (NUVARING) 0.12-0.015 MG/24HR vaginal ring Insert vaginally and leave in place for 3 consecutive weeks, then remove for 1 week.  3 each  2  . SUMAtriptan (IMITREX) 100 MG tablet Take 1 tablet (100 mg total) by mouth every 2 (two) hours as needed for migraine. May repeat in 2 hours if headache persists or recurs.  10 tablet  3  . valACYclovir (VALTREX) 1000 MG tablet Take 1 tablet (1,000 mg total) by mouth 2 (two) times daily.  20 tablet  3   No current facility-administered medications on file prior to visit.    BP 102/82  Temp(Src) 98.7 F (37.1 C) (Oral)  Wt 202 lb (91.627 kg)chart     Objective:   Physical Exam  Constitutional: She is oriented to person, place, and time. She appears well-developed and well-nourished.  Neck: Normal range of motion. Neck supple.  Cardiovascular: Normal rate, regular rhythm and normal heart sounds.   Pulmonary/Chest: Effort normal and breath sounds normal.  Musculoskeletal: She exhibits tenderness. She exhibits no edema.  Bilateral knee pain to palpation. Positive crepitus. No edema.   Neurological: She is alert and oriented to  person, place, and time.  Skin: Skin is warm and dry.  Psychiatric: She has a normal mood and affect.     Right knee: Informed consent obtained and the patient's knee was prepped with betadine. Local anesthesia was obtained with topical spray. Then 40 mg of Depo-Medrol and 1/2 cc of lidocaine was injected into the joint space. The patient tolerated the procedure without complications. Post injection care discussed with patient.  Left knee:  Informed consent obtained and the patient's knee was prepped with betadine. Local anesthesia was obtained with topical spray. Then 40 mg of Depo-Medrol and 1/2 cc of lidocaine was injected into the joint space. The patient tolerated the procedure without complications. Post injection care discussed with  patient.    Assessment & Plan:  Jennifer Cardenas was seen today for knee pain.  Diagnoses and associated orders for this visit:  Osteoarthritis of left knee - methylPREDNISolone acetate (DEPO-MEDROL) injection 40 mg; Inject 1 mL (40 mg total) into the articular space once.  Osteoarthritis of right knee - methylPREDNISolone acetate (DEPO-MEDROL) injection 40 mg; Inject 1 mL (40 mg total) into the articular space once.  Other Orders - amitriptyline (ELAVIL) 25 MG tablet; Take 1 tablet (25 mg total) by mouth at bedtime. - meloxicam (MOBIC) 15 MG tablet; Take 1 tablet (15 mg total) by mouth daily.   Call the office with any questions or concerns. Recheck her schedule, and as needed.

## 2013-07-07 ENCOUNTER — Other Ambulatory Visit: Payer: Self-pay | Admitting: Family

## 2013-07-09 ENCOUNTER — Encounter: Payer: Self-pay | Admitting: Family

## 2013-09-21 ENCOUNTER — Encounter: Payer: Self-pay | Admitting: Obstetrics and Gynecology

## 2013-09-24 ENCOUNTER — Encounter: Payer: Self-pay | Admitting: Family

## 2013-09-24 ENCOUNTER — Ambulatory Visit (INDEPENDENT_AMBULATORY_CARE_PROVIDER_SITE_OTHER): Payer: 59 | Admitting: Family

## 2013-09-24 VITALS — BP 104/78 | Temp 98.5°F | Wt 200.0 lb

## 2013-09-24 DIAGNOSIS — K5900 Constipation, unspecified: Secondary | ICD-10-CM

## 2013-09-24 MED ORDER — SCOPOLAMINE 1 MG/3DAYS TD PT72: 1.0000 | MEDICATED_PATCH | TRANSDERMAL | Status: DC

## 2013-09-24 NOTE — Patient Instructions (Addendum)

## 2013-09-24 NOTE — Progress Notes (Signed)
Pre visit review using our clinic review tool, if applicable. No additional management support is needed unless otherwise documented below in the visit note. 

## 2013-09-25 ENCOUNTER — Encounter: Payer: Self-pay | Admitting: Family

## 2013-09-25 NOTE — Progress Notes (Signed)
Subjective:    Patient ID: Jennifer Cardenas, female    DOB: 18-Nov-1975, 38 y.o.   MRN: [id number]  HPI 38 year old AAF, nonsmoker, and is in today with complaints of constipation has a chronic issue. She is becoming very concerned that she is going out of the country and concerned that she may have a bowel obstruction. Has taken multiple laxatives to include magnesium titrate, Senokot, prune juice, and drink Epsom salts and water. 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 Reports a small bowel movement yesterday. Does not feel bloated or full. Reports appetite and decreased recently.  Patient is requesting Transderm-Scop patch for motion sickness while she is on her cruise.  Review of Systems  Constitutional: Negative.   HENT: Negative.   Respiratory: Negative.   Cardiovascular: Negative.   Endocrine: Negative.   Genitourinary: Negative.   Musculoskeletal: Negative.   Skin: Negative.   Allergic/Immunologic: Negative.   Neurological: Negative.   Hematological: Negative.   Psychiatric/Behavioral: Negative.    Past Medical History  Diagnosis Date  . Herpes simplex     H/O HSV 1   and on leg  . STD (sexually transmitted disease)     HSV 1  . Hx of migraines   . Acne   . Hyperemesis     History   Social History  . Marital Status: Legally Separated    Spouse Name: N/A    Number of Children: N/A  . Years of Education: N/A   Occupational History  . Not on file.   Social History Main Topics  . Smoking status: Never Smoker   . Smokeless tobacco: Never Used  . Alcohol Use: No  . Drug Use: No  . Sexual Activity: No     Comment: Implanon due to be removed 11/2012   Other Topics Concern  . Not on file   Social History Narrative  . No narrative on file    Past Surgical History  Procedure Laterality Date  . Bunionectomy Bilateral     1996 and 1998  . Excision of right breast mass Right     benign    Family History  Problem Relation Age of Onset    . Bipolar disorder Mother   . Hepatitis C Mother     Allergies  Allergen Reactions  . Acne [Benzoyl Peroxide]     Current Outpatient Prescriptions on File Prior to Visit  Medication Sig Dispense Refill  . amitriptyline (ELAVIL) 25 MG tablet TAKE 1 TABLET (25 MG TOTAL) BY MOUTH AT BEDTIME.  90 tablet  0  . etonogestrel-ethinyl estradiol (NUVARING) 0.12-0.015 MG/24HR vaginal ring Insert vaginally and leave in place for 3 consecutive weeks, then remove for 1 week.  3 each  2  . meloxicam (MOBIC) 15 MG tablet Take 1 tablet (15 mg total) by mouth daily.  30 tablet  0  . SUMAtriptan (IMITREX) 100 MG tablet Take 1 tablet (100 mg total) by mouth every 2 (two) hours as needed for migraine. May repeat in 2 hours if headache persists or recurs.  10 tablet  3  . valACYclovir (VALTREX) 1000 MG tablet Take 1 tablet (1,000 mg total) by mouth 2 (two) times daily.  20 tablet  3   No current facility-administered medications on file prior to visit.    BP 104/78  Temp(Src) 98.5 F (36.9 C) (Oral)  Wt 200 lb (90.719 kg)chart and    Objective:   Physical Exam  Constitutional: She is oriented to  person, place, and time. She appears well-developed and well-nourished.  HENT:  Right Ear: External ear normal.  Left Ear: External ear normal.  Nose: Nose normal.  Mouth/Throat: Oropharynx is clear and moist.  Neck: Normal range of motion. Neck supple.  Cardiovascular: Normal rate, regular rhythm and normal heart sounds.   Pulmonary/Chest: Effort normal and breath sounds normal.  Abdominal: Soft. Bowel sounds are normal.  Musculoskeletal: Normal range of motion.  Neurological: She is alert and oriented to person, place, and time.  Skin: Skin is warm and dry.  Psychiatric: She has a normal mood and affect.          Assessment & Plan:  Denette was seen today for constipation.  Diagnoses and associated orders for this visit:  Unspecified constipation - DG Abd 1 View; Future  Other  Orders - scopolamine (TRANSDERM-SCOP) 1 MG/3DAYS; Place 1 patch (1.5 mg total) onto the skin every 3 (three) days.   Call the office with any questions or concerns. Recheck as scheduled and as needed.

## 2013-09-29 ENCOUNTER — Ambulatory Visit: Payer: 59 | Admitting: Family

## 2013-12-02 ENCOUNTER — Other Ambulatory Visit: Payer: Self-pay | Admitting: Family

## 2014-01-12 ENCOUNTER — Emergency Department (INDEPENDENT_AMBULATORY_CARE_PROVIDER_SITE_OTHER)
Admission: EM | Admit: 2014-01-12 | Discharge: 2014-01-12 | Disposition: A | Discharge status: Home / Self Care | Payer: 59 | Source: Home / Self Care | Attending: Family Medicine | Admitting: Family Medicine

## 2014-01-12 ENCOUNTER — Encounter (HOSPITAL_COMMUNITY): Payer: Self-pay | Admitting: Emergency Medicine

## 2014-01-12 DIAGNOSIS — K5641 Fecal impaction: Secondary | ICD-10-CM

## 2014-01-12 MED ORDER — POLYETHYLENE GLYCOL 3350 17 GM/SCOOP PO POWD: 17.0000 g | Freq: Every day | ORAL | Status: DC

## 2014-01-12 MED ORDER — BISACODYL 10 MG RE SUPP: 10.0000 mg | RECTAL | Status: DC | PRN

## 2014-01-12 NOTE — Discharge Instructions (Signed)
Thank you for coming in today.' Take miralax daily.  Use the suppositories as needed.  If your belly pain worsens, or you have high fever, bad vomiting, blood in your stool or black tarry stool go to the Emergency Room.    Fecal Impaction A fecal impaction happens when there is a large, firm amount of stool (or feces) that cannot be passed. The impacted stool is usually in the rectum, which is the lowest part of the large bowel. The impacted stool can block the colon and cause significant problems. CAUSES  The longer stool stays in the rectum, the harder it gets. Anything that slows down your bowel movements can lead to fecal impaction, such as:  Constipation. This can be a long-standing (chronic) problem or can happen suddenly (acute).  Painful conditions of the rectum, such as hemorrhoids or anal fissures. The pain of these conditions can make you try to avoid having bowel movements.  Narcotic pain-relieving medicines, such as methadone, morphine, or codeine.  Not drinking enough fluids.  Inactivity and bed rest over long periods of time.  Diseases of the brain or nervous system that damage the nerves controlling the muscles of the intestines. SIGNS AND SYMPTOMS   Lack of normal bowel movements or changes in bowel patterns.  Sense of fullness in the rectum but unable to pass stool.  Pain or cramps in the abdominal area (often after meals).  Thin, watery discharge from the rectum. DIAGNOSIS  Your health care provider may suspect that you have a fecal impaction based on your symptoms and a physical exam. This will include an exam of your rectum. Sometimes X-rays or lab testing may be needed to confirm the diagnosis and to be sure there are no other problems.  TREATMENT   Initially an impaction can be removed manually. Using a gloved finger, your health care provider can remove hard stool from your rectum.  Medicine is sometimes needed. A suppository or enema can be given in the  rectum to soften the stool, which can stimulate a bowel movement. Medicines can also be given by mouth (orally).  Though rare, surgery may be needed if the colon has torn (perforated) due to blockage. HOME CARE INSTRUCTIONS   Develop regular bowel habits. This could include getting in the habit of having a bowel movement after your morning cup of coffee or after eating. Be sure to allow yourself enough time on the toilet.  Maintain a high-fiber diet.  Drink enough fluids to keep your urine clear or pale yellow as directed by your health care provider.  Exercise regularly.  If you begin to get constipated, increase the amount of fiber in your diet. Eat plenty of fruits, vegetables, whole wheat breads, bran, oatmeal, and similar products.  Take natural fiber laxatives or other laxatives only as directed by your health care provider. SEEK MEDICAL CARE IF:   You have ongoing rectal pain.  You require enemas or suppositories more than twice a week.  You have rectal bleeding.  You have continued problems, or you develop abdominal pain.  You have thin, pencil-like stools. SEEK IMMEDIATE MEDICAL CARE IF:  You have black or tarry stools. MAKE SURE YOU:   Understand these instructions.  Will watch your condition.  Will get help right away if you are not doing well or get worse. Document Released: 12/04/2003 Document Revised: 01/01/2013 Document Reviewed: 09/17/2012 [facility] Patient Information 2015 [place], [place]. This information is not intended to replace advice given to you by your health care provider.  Make sure you discuss any questions you have with your health care provider. ° °

## 2014-01-12 NOTE — ED Provider Notes (Signed)
Jennifer Cardenas is a 38 y.o. female who presents to Urgent Care today for fecal impaction. Patient has a history of chronic constipation and has not been taking her medications regularly. She developed fecal impaction today. She feels a tremendous pressure and urge defecate but is unable. She denies any abdominal pain fevers or chills nausea or vomiting. She has tried over-the-counter medications which have not helped.   Past Medical History  Diagnosis Date  . Herpes simplex     H/O HSV 1   and on leg  . STD (sexually transmitted disease)     HSV 1  . Hx of migraines   . Acne   . Hyperemesis    History  Substance Use Topics  . Smoking status: Never Smoker   . Smokeless tobacco: Never Used  . Alcohol Use: No   ROS as above Medications: No current facility-administered medications for this encounter.   Current Outpatient Prescriptions  Medication Sig Dispense Refill  . amitriptyline (ELAVIL) 25 MG tablet TAKE 1 TABLET BY MOUTH AT BEDTIME  90 tablet  0  . bisacodyl (DULCOLAX) 10 MG suppository Place 1 suppository (10 mg total) rectally as needed for moderate constipation.  12 suppository  0  . etonogestrel-ethinyl estradiol (NUVARING) 0.12-0.015 MG/24HR vaginal ring Insert vaginally and leave in place for 3 consecutive weeks, then remove for 1 week.  3 each  2  . meloxicam (MOBIC) 15 MG tablet Take 1 tablet (15 mg total) by mouth daily.  30 tablet  0  . polyethylene glycol powder (GLYCOLAX/MIRALAX) powder Take 17 g by mouth daily.  850 g  1  . scopolamine (TRANSDERM-SCOP) 1 MG/3DAYS Place 1 patch (1.5 mg total) onto the skin every 3 (three) days.  4 patch  0  . SUMAtriptan (IMITREX) 100 MG tablet Take 1 tablet (100 mg total) by mouth every 2 (two) hours as needed for migraine. May repeat in 2 hours if headache persists or recurs.  10 tablet  3  . valACYclovir (VALTREX) 1000 MG tablet Take 1 tablet (1,000 mg total) by mouth 2 (two) times daily.  20 tablet  3    Exam:  BP 128/81  Pulse  127  Temp(Src) 98.2 F (36.8 C) (Oral)  Resp 16  Ht 5\' 8"  (1.727 m)  Wt 200 lb (90.719 kg)  BMI 30.42 kg/m2  SpO2 100% Gen: Well NAD Rectal: Normal appearing external anus. Hard balls of stool in the rectum. Multiple hard balls of stool were removed. Patient felt much better.  No results found for this or any previous visit (from the past 24 hour(s)). No results found.  Assessment and Plan: 38 y.o. female with fecal impaction relieved. Treatment with MiraLAX and Dulcolax suppository. Followup with PCP.  Discussed warning signs or symptoms. Please see discharge instructions. Patient expresses understanding.     Gregor Hams, MD 01/12/14 [phone]

## 2014-01-12 NOTE — ED Notes (Signed)
Pt here today for constipation and for a disimpaction

## 2014-01-26 ENCOUNTER — Encounter (HOSPITAL_COMMUNITY): Payer: Self-pay | Admitting: Emergency Medicine

## 2014-02-28 ENCOUNTER — Encounter: Payer: Self-pay | Admitting: Family

## 2014-04-29 ENCOUNTER — Ambulatory Visit (INDEPENDENT_AMBULATORY_CARE_PROVIDER_SITE_OTHER): Payer: 59 | Admitting: Family

## 2014-04-29 ENCOUNTER — Encounter: Payer: Self-pay | Admitting: Family

## 2014-04-29 VITALS — BP 114/78 | HR 111 | Temp 97.9°F | Wt 200.0 lb

## 2014-04-29 DIAGNOSIS — G43009 Migraine without aura, not intractable, without status migrainosus: Secondary | ICD-10-CM

## 2014-04-29 DIAGNOSIS — K5900 Constipation, unspecified: Secondary | ICD-10-CM

## 2014-04-29 DIAGNOSIS — K5909 Other constipation: Secondary | ICD-10-CM | POA: Insufficient documentation

## 2014-04-29 MED ORDER — SUMATRIPTAN SUCCINATE 100 MG PO TABS: 100.0000 mg | ORAL_TABLET | ORAL | Status: DC | PRN

## 2014-04-29 MED ORDER — PROPRANOLOL HCL ER 80 MG PO CP24: 80.0000 mg | ORAL_CAPSULE | Freq: Every day | ORAL | Status: DC

## 2014-04-29 MED ORDER — VALACYCLOVIR HCL 1 G PO TABS: 1000.0000 mg | ORAL_TABLET | Freq: Two times a day (BID) | ORAL | Status: DC

## 2014-04-29 MED ORDER — KETOROLAC TROMETHAMINE 60 MG/2ML IM SOLN
60.0000 mg | Freq: Once | INTRAMUSCULAR | Status: AC
  Administered 2014-04-29: 60 mg via INTRAMUSCULAR

## 2014-04-29 MED ORDER — LINACLOTIDE 145 MCG PO CAPS: 145.0000 ug | ORAL_CAPSULE | Freq: Every day | ORAL | Status: DC

## 2014-04-29 NOTE — Patient Instructions (Signed)

## 2014-04-29 NOTE — Progress Notes (Signed)
Pre visit review using our clinic review tool, if applicable. No additional management support is needed unless otherwise documented below in the visit note. 

## 2014-04-29 NOTE — Progress Notes (Signed)
Subjective:    Patient ID: Jennifer Cardenas, female    DOB: 04-12-1975, 39 y.o.   MRN: [id number]  HPI 39 year old African-American female, nonsmoker with a history of migraine headaches is in today for recheck. Reports having occasional migraine headaches approximately once or twice a month typically around her menstrual cycle. Has had FMLA paperwork filled out in the past to cover her for days that she may miss from work. Denies any aura. Headaches typically occurs around the eye and temple area on the left or right side of the hand with accompanying phonophobia and photophobia. Additionally reports nausea. Stop amitriptyline due to weight gain. Typically takes Imitrex that works. Would like to consider a different preventative medication.  Patient has concerns of chronic constipation. Reports defecating 2-3 times per week. Was seen at the urgent care clinic with chronic constipation and was found to be impacted. Patient was disimpacted. Reports taken MiraLAX, Colace, milk of magnesia, and laxatives All been ineffective. Reports decreased water intake and does not exercise.   Review of Systems  Constitutional: Negative.   HENT: Negative for congestion, facial swelling, mouth sores and postnasal drip.   Eyes: Positive for photophobia.  Respiratory: Negative.   Cardiovascular: Negative.   Gastrointestinal: Positive for nausea, abdominal pain and constipation. Negative for abdominal distention.  Endocrine: Negative.   Musculoskeletal: Negative.   Skin: Negative.   Allergic/Immunologic: Negative.   Neurological: Positive for headaches. Negative for dizziness and numbness.  Hematological: Negative.   Psychiatric/Behavioral: Negative.    Past Medical History  Diagnosis Date  . Herpes simplex     H/O HSV 1   and on leg  . STD (sexually transmitted disease)     HSV 1  . Hx of migraines   . Acne   . Hyperemesis     History   Social History  . Marital Status: Legally Separated    Spouse  Name: N/A    Number of Children: N/A  . Years of Education: N/A   Occupational History  . Not on file.   Social History Main Topics  . Smoking status: Never Smoker   . Smokeless tobacco: Never Used  . Alcohol Use: No  . Drug Use: No  . Sexual Activity: No     Comment: Implanon due to be removed 11/2012   Other Topics Concern  . Not on file   Social History Narrative    Past Surgical History  Procedure Laterality Date  . Bunionectomy Bilateral     1996 and 1998  . Excision of right breast mass Right     benign    Family History  Problem Relation Age of Onset  . Bipolar disorder Mother   . Hepatitis C Mother     Allergies  Allergen Reactions  . Acne [Benzoyl Peroxide]     Current Outpatient Prescriptions on File Prior to Visit  Medication Sig Dispense Refill  . scopolamine (TRANSDERM-SCOP) 1 MG/3DAYS Place 1 patch (1.5 mg total) onto the skin every 3 (three) days. (Patient not taking: Reported on 04/29/2014) 4 patch 0   No current facility-administered medications on file prior to visit.    BP 114/78 mmHg  Pulse 111  Temp(Src) 97.9 F (36.6 C) (Oral)  Wt 200 lb (90.719 kg)chart    Objective:   Physical Exam  Constitutional: She is oriented to person, place, and time. She appears well-developed and well-nourished.  HENT:  Right Ear: External ear normal.  Left Ear: External ear normal.  Nose: Nose normal.  Mouth/Throat: Oropharynx  is clear and moist.  Neck: Normal range of motion. Neck supple. No thyromegaly present.  Cardiovascular: Normal rate, regular rhythm and normal heart sounds.   Pulmonary/Chest: Effort normal and breath sounds normal.  Abdominal: Soft. Bowel sounds are normal.  Musculoskeletal: Normal range of motion. She exhibits no edema or tenderness.  Neurological: She is alert and oriented to person, place, and time. She has normal reflexes. She displays normal reflexes. No cranial nerve deficit. Coordination normal.  Skin: Skin is warm and  dry.  Psychiatric: She has a normal mood and affect.          Assessment & Plan:  Ramonia was seen today for follow-up.  Diagnoses and associated orders for this visit:  Migraine without aura and without status migrainosus, not intractable - ketorolac (TORADOL) injection 60 mg; Inject 2 mLs (60 mg total) into the muscle once.  Chronic constipation  Other Orders - propranolol ER (INDERAL LA) 80 MG 24 hr capsule; Take 1 capsule (80 mg total) by mouth at bedtime. - SUMAtriptan (IMITREX) 100 MG tablet; Take 1 tablet (100 mg total) by mouth every 2 (two) hours as needed for migraine. May repeat in 2 hours if headache persists or recurs. - valACYclovir (VALTREX) 1000 MG tablet; Take 1 tablet (1,000 mg total) by mouth 2 (two) times daily. - Linaclotide (LINZESS) 145 MCG CAPS capsule; Take 1 capsule (145 mcg total) by mouth daily.    Increase water intake. Encouraged physical activity 45 minutes 3-4 days a week minimum. Consider Linzess 145 mg once daily for chronic constipation. Inderal 80 mg for migraine prevention. Continue Imitrex as needed. Valtrex renewed. Call the office with any questions or concerns. Recheck in 3-4 weeks and sooner as needed.

## 2014-08-04 ENCOUNTER — Telehealth: Payer: Self-pay | Admitting: Obstetrics and Gynecology

## 2014-08-04 ENCOUNTER — Telehealth: Payer: Self-pay | Admitting: Nurse Practitioner

## 2014-08-04 NOTE — Telephone Encounter (Signed)
Patient has a yeast infection, has used OTC medication with no relief. Patient is asking if a prescription could be called to her pharmacy. Patient is aware she may need an appointment. Patient said you could leave details on her voice mail. [facility]. Last seen 01/27/13. Patient scheduled aex for next available 10/02/14 with Dr.Silva.

## 2014-08-04 NOTE — Telephone Encounter (Signed)
Spoke with patient. Patient state that she feels she has a yeast infection. Has used OTC Monistat with no relief. Advised will need to be seen in office for further evaluation and proper treatment. Patient is agreeable. Offered appointment for today but patient declines. Appointment scheduled for tomorrow at 8:30am with Milford Cage, [place]. Patient is agreeable to date and time.  Routing to provider for final review. Patient agreeable to disposition. Patient aware provider will review message and nurse will return call with any additional instructions or change of disposition. Will close encounter.

## 2014-08-04 NOTE — Telephone Encounter (Signed)
Tried to leave message regarding cancelled appointment. No voicemail set up for cell phone and home number is non-working number.

## 2014-08-04 NOTE — Telephone Encounter (Signed)
Spoke with patient. Appointment rescheduled from 5/11 to 5/12 at 3:30pm with Milford Cage, FNP due to provider being out tomorrow. Patient is agreeable to appointment date and time.  Routing to provider for final review. Patient agreeable to disposition. Patient aware provider will review message and nurse will return call with any additional instructions or change of disposition. Will close encounter.

## 2014-08-05 ENCOUNTER — Ambulatory Visit: Payer: PRIVATE HEALTH INSURANCE | Admitting: Nurse Practitioner

## 2014-08-06 ENCOUNTER — Encounter: Payer: Self-pay | Admitting: Nurse Practitioner

## 2014-08-06 ENCOUNTER — Ambulatory Visit (INDEPENDENT_AMBULATORY_CARE_PROVIDER_SITE_OTHER): Payer: 59 | Admitting: Nurse Practitioner

## 2014-08-06 VITALS — BP 100/74 | HR 82 | Resp 20 | Ht 68.0 in | Wt 202.6 lb

## 2014-08-06 DIAGNOSIS — N760 Acute vaginitis: Secondary | ICD-10-CM | POA: Diagnosis not present

## 2014-08-06 NOTE — Progress Notes (Signed)
39 y.o.Legally Separated AA G2P2 here with complaint of vaginal symptoms of itching, burning, and increase discharge. Describes discharge as white thick and treated with OTC Monistat about 2 weeks ago.  She then had a regular menses 07/28/14 flow for 3 days.  Now symptoms still there but not as bad.  Discharge is thinner.  Denies new personal products or vaginal dryness.   No STD concerns. Urinary symptoms none . Contraception is condoms if SA.   O:Healthy female WDWN Affect: normal, orientation x 3  Exam: alert, no distress Abdomen: soft and non tender Lymph node: no enlargement or tenderness Pelvic exam: External genital: normal female BUS: negative Vagina: thin grey discharge noted.   Affirm taken Cervix: normal, non tender, no CMT Uterus: normal, non tender Adnexa:normal,  Not examined   A: R/O BV or yeast   P: Discussed findings of vaginitis and etiology. Discussed Aveeno or baking soda sitz bath for comfort. Avoid moist clothes or pads for extended period of time. If working out in gym clothes or swim suits for long periods of time change underwear or bottoms of swimsuit if possible. Olive Oil/Coconut Oil use for skin protection prior to activity can be used to external skin.  Rx: will hold pending test results  She plans to discuss with Dr. Quincy Simmonds other methods of birth control and to get STD testing at [facility] first of June.  RV prn

## 2014-08-06 NOTE — Patient Instructions (Signed)
Will call in the morning with test results

## 2014-08-07 ENCOUNTER — Other Ambulatory Visit: Payer: Self-pay | Admitting: Nurse Practitioner

## 2014-08-07 LAB — WET PREP BY MOLECULAR PROBE
Candida species: NEGATIVE
Gardnerella vaginalis: POSITIVE — AB
TRICHOMONAS VAG: NEGATIVE

## 2014-08-07 MED ORDER — METRONIDAZOLE 0.75 % VA GEL: 1.0000 | Freq: Every day | VAGINAL | Status: DC

## 2014-08-07 NOTE — Progress Notes (Signed)
Encounter reviewed by Dr. Brook Silva.  

## 2014-10-02 ENCOUNTER — Ambulatory Visit (INDEPENDENT_AMBULATORY_CARE_PROVIDER_SITE_OTHER): Payer: 59 | Admitting: Obstetrics and Gynecology

## 2014-10-02 ENCOUNTER — Encounter: Payer: Self-pay | Admitting: Obstetrics and Gynecology

## 2014-10-02 VITALS — BP 118/76 | HR 88 | Resp 18 | Ht 68.0 in | Wt 204.0 lb

## 2014-10-02 DIAGNOSIS — Z113 Encounter for screening for infections with a predominantly sexual mode of transmission: Secondary | ICD-10-CM | POA: Diagnosis not present

## 2014-10-02 DIAGNOSIS — Z01419 Encounter for gynecological examination (general) (routine) without abnormal findings: Secondary | ICD-10-CM | POA: Diagnosis not present

## 2014-10-02 DIAGNOSIS — Z0000 Encounter for general adult medical examination without abnormal findings: Secondary | ICD-10-CM

## 2014-10-02 LAB — POCT URINALYSIS DIPSTICK
LEUKOCYTES UA: NEGATIVE
UROBILINOGEN UA: NEGATIVE
pH, UA: 5

## 2014-10-02 LAB — COMPREHENSIVE METABOLIC PANEL
ALBUMIN: 4 g/dL (ref 3.5–5.2)
AST: 14 U/L (ref 0–37)
Alkaline Phosphatase: 46 U/L (ref 39–117)
BUN: 10 mg/dL (ref 6–23)
CALCIUM: 9.4 mg/dL (ref 8.4–10.5)
CO2: 28 mEq/L (ref 19–32)
CREATININE: 0.91 mg/dL (ref 0.50–1.10)
Chloride: 103 mEq/L (ref 96–112)
Glucose, Bld: 91 mg/dL (ref 70–99)
POTASSIUM: 4 meq/L (ref 3.5–5.3)
Sodium: 140 mEq/L (ref 135–145)
Total Bilirubin: 0.3 mg/dL (ref 0.2–1.2)
Total Protein: 6.9 g/dL (ref 6.0–8.3)

## 2014-10-02 LAB — LIPID PANEL
Cholesterol: 154 mg/dL (ref 0–200)
HDL: 42 mg/dL — ABNORMAL LOW (ref >=46)
LDL Cholesterol: 93 mg/dL (ref 0–99)
Total CHOL/HDL Ratio: 3.7 Ratio
Triglycerides: 97 mg/dL (ref <150)
VLDL: 19 mg/dL (ref 0–40)

## 2014-10-02 LAB — TSH: TSH: 1.08 u[IU]/mL (ref 0.350–4.500)

## 2014-10-02 MED ORDER — LEVONORGEST-ETH ESTRAD 91-DAY 0.1-0.02 & 0.01 MG PO TABS: 1.0000 | ORAL_TABLET | Freq: Every day | ORAL | Status: DC

## 2014-10-02 NOTE — Patient Instructions (Addendum)
EXERCISE AND DIET:  We recommended that you start or continue a regular exercise program for good health. Regular exercise means any activity that makes your heart beat faster and makes you sweat.  We recommend exercising at least 30 minutes per day at least 3 days a week, preferably 4 or 5.  We also recommend a diet low in fat and sugar.  Inactivity, poor dietary choices and obesity can cause diabetes, heart attack, stroke, and kidney damage, among others.    ALCOHOL AND SMOKING:  Women should limit their alcohol intake to no more than 7 drinks/beers/glasses of wine (combined, not each!) per week. Moderation of alcohol intake to this level decreases your risk of breast cancer and liver damage. And of course, no recreational drugs are part of a healthy lifestyle.  And absolutely no smoking or even second hand smoke. Most people know smoking can cause heart and lung diseases, but did you know it also contributes to weakening of your bones? Aging of your skin?  Yellowing of your teeth and nails?  CALCIUM AND VITAMIN D:  Adequate intake of calcium and Vitamin D are recommended.  The recommendations for exact amounts of these supplements seem to change often, but generally speaking 600 mg of calcium (either carbonate or citrate) and 800 units of Vitamin D per day seems prudent. Certain women may benefit from higher intake of Vitamin D.  If you are among these women, your doctor will have told you during your visit.    PAP SMEARS:  Pap smears, to check for cervical cancer or precancers,  have traditionally been done yearly, although recent scientific advances have shown that most women can have pap smears less often.  However, every woman still should have a physical exam from her gynecologist every year. It will include a breast check, inspection of the vulva and vagina to check for abnormal growths or skin changes, a visual exam of the cervix, and then an exam to evaluate the size and shape of the uterus and  ovaries.  And after 40 years of age, a rectal exam is indicated to check for rectal cancers. We will also provide age appropriate advice regarding health maintenance, like when you should have certain vaccines, screening for sexually transmitted diseases, bone density testing, colonoscopy, mammograms, etc.   MAMMOGRAMS:  All women over 40 years old should have a yearly mammogram. Many facilities now offer a "3D" mammogram, which may cost around $50 extra out of pocket. If possible,  we recommend you accept the option to have the 3D mammogram performed.  It both reduces the number of women who will be called back for extra views which then turn out to be normal, and it is better than the routine mammogram at detecting truly abnormal areas.    COLONOSCOPY:  Colonoscopy to screen for colon cancer is recommended for all women at age 50.  We know, you hate the idea of the prep.  We agree, BUT, having colon cancer and not knowing it is worse!!  Colon cancer so often starts as a polyp that can be seen and removed at colonscopy, which can quite literally save your life!  And if your first colonoscopy is normal and you have no family history of colon cancer, most women don't have to have it again for 10 years.  Once every ten years, you can do something that may end up saving your life, right?  We will be happy to help you get it scheduled when you are ready.    Be sure to check your insurance coverage so you understand how much it will cost.  It may be covered as a preventative service at no cost, but you should check your particular policy.     Ethinyl Estradiol; Levonorgestrel tablets What is this medicine? ETHINYL ESTRADIOL; LEVONORGESTREL (ETH in il es tra DYE ole; LEE voh nor jes trel) is an oral contraceptive. It combines two types of female hormones, an estrogen and a progestin. They are used to prevent ovulation and pregnancy. This medicine may be used for other purposes; ask your health care provider or  pharmacist if you have questions. COMMON BRAND NAME(S): [place], [place], Amethia, Amethia Lo, [place], [place], [place]ra[place]28, Aviane, Cam[place], Camrese Lo, [place], [place], [place], [place], [place], [place], [place], [place], [place], [place], [place], [place], [place], [place], [place], [place], [place], [place], [place], [place], [place], [place], [place], [place], [place], [place], [place], [place], [place] What should I tell my health care provider before I take this medicine? They need to know if you have or ever had any of these conditions: -abnormal vaginal bleeding -blood vessel disease or blood clots -breast, cervical, endometrial, ovarian, liver, or uterine cancer -diabetes -gallbladder disease -heart disease or recent heart attack -high blood pressure -high cholesterol -kidney disease -liver disease -migraine headaches -stroke -systemic lupus erythematosus (SLE) -tobacco smoker -an unusual or allergic reaction to estrogens, progestins, other medicines, foods, dyes, or preservatives -pregnant or trying to get pregnant -breast-feeding How should I use this medicine? Take this medicine by mouth. To reduce nausea, this medicine may be taken with food. Follow the directions on the prescription label. Take this medicine at the same time each day and in the order directed on the package. Do not take your medicine more often than directed. Contact your pediatrician regarding the use of this medicine in children. Special care may be needed. This medicine has been used in female children who have started having menstrual periods. A patient package insert for the product will be given with each prescription and refill. Read this sheet carefully each time. The sheet may change frequently. Overdosage: If you think you have taken too much of this medicine contact a poison control center or emergency room at once. NOTE: This medicine is only for you. Do not share this medicine  with others. What if I miss a dose? If you miss a dose, refer to the patient information sheet you received with your medicine for direction. If you miss more than one pill, this medicine may not be as effective and you may need to use another form of birth control. What may interact with this medicine? -acetaminophen -antibiotics or medicines for infections, especially rifampin, rifabutin, rifapentine, and griseofulvin, and possibly penicillins or tetracyclines -aprepitant -ascorbic acid (vitamin C) -atorvastatin -barbiturate medicines, such as phenobarbital -bosentan -carbamazepine -caffeine -clofibrate -cyclosporine -dantrolene -doxercalciferol -felbamate -grapefruit juice -hydrocortisone -medicines for anxiety or sleeping problems, such as diazepam or temazepam -medicines for diabetes, including pioglitazone -mineral oil -modafinil -mycophenolate -nefazodone -oxcarbazepine -phenytoin -prednisolone -ritonavir or other medicines for HIV infection or AIDS -rosuvastatin -selegiline -soy isoflavones supplements -St. John's wort -tamoxifen or raloxifene -theophylline -thyroid hormones -topiramate -warfarin This list may not describe all possible interactions. Give your health care provider a list of all the medicines, herbs, non-prescription drugs, or dietary supplements you use. Also tell them if you smoke, drink alcohol, or use illegal drugs. Some items may interact with your medicine. What should I watch for while using this medicine? Visit your doctor or health care professional for regular checks on your progress. You  will need a regular breast and pelvic exam and Pap smear while on this medicine. Use an additional method of contraception during the first cycle that you take these tablets. If you have any reason to think you are pregnant, stop taking this medicine right away and contact your doctor or health care professional. If you are taking this medicine for hormone  related problems, it may take several cycles of use to see improvement in your condition. Smoking increases the risk of getting a blood clot or having a stroke while you are taking birth control pills, especially if you are more than 42 years old. You are strongly advised not to smoke. This medicine can make your body retain fluid, making your fingers, hands, or ankles swell. Your blood pressure can go up. Contact your doctor or health care professional if you feel you are retaining fluid. This medicine can make you more sensitive to the sun. Keep out of the sun. If you cannot avoid being in the sun, wear protective clothing and use sunscreen. Do not use sun lamps or tanning beds/booths. If you wear contact lenses and notice visual changes, or if the lenses begin to feel uncomfortable, consult your eye care specialist. In some women, tenderness, swelling, or minor bleeding of the gums may occur. Notify your dentist if this happens. Brushing and flossing your teeth regularly may help limit this. See your dentist regularly and inform your dentist of the medicines you are taking. If you are going to have elective surgery, you may need to stop taking this medicine before the surgery. Consult your health care professional for advice. This medicine does not protect you against HIV infection (AIDS) or any other sexually transmitted diseases. What side effects may I notice from receiving this medicine? Side effects that you should report to your doctor or health care professional as soon as possible: -breast tissue changes or discharge -changes in vaginal bleeding during your period or between your periods -chest pain -coughing up blood -dizziness or fainting spells -headaches or migraines -leg, arm or groin pain -severe or sudden headaches -stomach pain (severe) -sudden shortness of breath -sudden loss of coordination, especially on one side of the body -speech problems -symptoms of vaginal infection  like itching, irritation or unusual discharge -tenderness in the upper abdomen -vomiting -weakness or numbness in the arms or legs, especially on one side of the body -yellowing of the eyes or skin Side effects that usually do not require medical attention (report to your doctor or health care professional if they continue or are bothersome): -breakthrough bleeding and spotting that continues beyond the 3 initial cycles of pills -breast tenderness -mood changes, anxiety, depression, frustration, anger, or emotional outbursts -increased sensitivity to sun or ultraviolet light -nausea -skin rash, acne, or brown spots on the skin -weight gain (slight) This list may not describe all possible side effects. Call your doctor for medical advice about side effects. You may report side effects to FDA at 1-800-FDA-1088. Where should I keep my medicine? Keep out of the reach of children. Store at room temperature between 15 and 30 degrees C (59 and 86 degrees F). Throw away any unused medicine after the expiration date. NOTE: This sheet is a summary. It may not cover all possible information. If you have questions about this medicine, talk to your doctor, pharmacist, or health care provider.  2015, Elsevier/Gold Standard. (2013-06-23 10:20:56)

## 2014-10-02 NOTE — Progress Notes (Signed)
39 y.o. [id number] Legally Separated African American female here for annual exam.    Still having headaches.  Migraines with menses. No aura. [facility]  Used NuvaRing in past.  Would leave it in for 4 weeks at at time.  Would then leave it out for less than a week. Would like to try birth control pills for migraine prevention.  PCP:   Dutch Quint, FNP - will establish care with [facility].   Patient's last menstrual period was 09/21/2014.          Sexually active: Yes.    The current method of family planning is condoms most of the time.   Exercising: No.  The patient does not participate in regular exercise at present. Smoker:  no  Health Maintenance: Pap:  01/01/2013 neg hr hpv History of abnormal Pap:  Yes, 2000.  Colposcopy and no biopsy.  MMG:  02/10/2013 bi-rads 1: neg Colonoscopy:  n/a BMD:   n/a  Result  n/a TDaP:  Up to date  Screening Labs:  Hb today: , Urine today: Negative    reports that she has never smoked. She has never used smokeless tobacco. She reports that she does not drink alcohol or use illicit drugs.  Past Medical History  Diagnosis Date  . Herpes simplex     H/O HSV 1   and on leg  . STD (sexually transmitted disease)     HSV 1  . Hx of migraines   . Acne   . Hyperemesis     Past Surgical History  Procedure Laterality Date  . Bunionectomy Bilateral     1996 and 1998  . Excision of right breast mass Right     benign    Current Outpatient Prescriptions  Medication Sig Dispense Refill  . Linaclotide (LINZESS) 145 MCG CAPS capsule Take 1 capsule (145 mcg total) by mouth daily. 30 capsule 2  . metroNIDAZOLE (METROGEL) 0.75 % vaginal gel Place 1 Applicatorful vaginally at bedtime. 70 g 0  . valACYclovir (VALTREX) 1000 MG tablet Take 1 tablet (1,000 mg total) by mouth 2 (two) times daily. (Patient taking differently: Take 1,000 mg by mouth as needed. ) 20 tablet 3   No current facility-administered medications for this  visit.    Family History  Problem Relation Age of Onset  . Bipolar disorder Mother   . Hepatitis C Mother     ROS:  Pertinent items are noted in HPI.  Otherwise, a comprehensive ROS was negative.  Exam:   Ht 5\' 8"  (1.727 m)  Wt 204 lb (92.534 kg)  BMI 31.03 kg/m2  LMP 09/21/2014    General appearance: alert, cooperative and appears stated age Head: Normocephalic, without obvious abnormality, atraumatic Neck: no adenopathy, supple, symmetrical, trachea midline and thyroid normal to inspection and palpation Lungs: clear to auscultation bilaterally Breasts: normal appearance, no masses or tenderness, Inspection negative, No nipple retraction or dimpling, No nipple discharge or bleeding, No axillary or supraclavicular adenopathy Heart: regular rate and rhythm Abdomen: soft, non-tender; bowel sounds normal; no masses,  no organomegaly Extremities: extremities normal, atraumatic, no cyanosis or edema Skin: Skin color, texture, turgor normal. No rashes or lesions Lymph nodes: Cervical, supraclavicular, and axillary nodes normal. No abnormal inguinal nodes palpated Neurologic: Grossly normal  Pelvic: External genitalia:  no lesions              Urethra:  normal appearing urethra with no masses, tenderness or lesions  Bartholins and Skenes: normal                 Vagina: normal appearing vagina with normal color and discharge, no lesions              Cervix: no lesions              Pap taken: Yes.   Bimanual Exam:  Uterus:  normal size, contour, position, consistency, mobility, non-tender              Adnexa: normal adnexa and no mass, fullness, tenderness              Rectovaginal: Yes.  .  Confirms.              Anus:  normal sphincter tone, no lesions  Chaperone was present for exam.  Assessment:   Well woman visit with normal exam. Menstrual migraines and migraines in general.  No aura.  Hx of abnormal pap.   Plan: Yearly mammogram recommended after age 38.   Recommended self breast exam.  Pap and HR HPV as above. Discussed Calcium, Vitamin D, regular exercise program including cardiovascular and weight bearing exercise. Labs performed.  Yes.  .   See orders. Routine labs and STD testing.  Stop NuvaRing.  Refills given on medications.  Yes.  .  See orders.  Lo Seasonique.  Instructed in use.  Follow up annually and prn.  Follow up for a recheck in 3 months.      After visit summary provided.

## 2014-10-03 LAB — HEPATITIS C ANTIBODY: HCV Ab: NEGATIVE

## 2014-10-03 LAB — CBC
HEMATOCRIT: 37 % (ref 36.0–46.0)
HEMOGLOBIN: 11.7 g/dL — AB (ref 12.0–15.0)
MCH: 28.6 pg (ref 26.0–34.0)
MCHC: 31.6 g/dL (ref 30.0–36.0)
MCV: 90.5 fL (ref 78.0–100.0)
MPV: 11.1 fL (ref 8.6–12.4)
Platelets: 288 10*3/uL (ref 150–400)
RBC: 4.09 MIL/uL (ref 3.87–5.11)
RDW: 14.4 % (ref 11.5–15.5)
WBC: 9.5 10*3/uL (ref 4.0–10.5)

## 2014-10-03 LAB — STD PANEL
HIV 1&2 Ab, 4th Generation: NONREACTIVE
Hepatitis B Surface Ag: NEGATIVE

## 2014-10-05 LAB — HEMOGLOBIN, FINGERSTICK: HEMOGLOBIN, FINGERSTICK: 11.6 g/dL — AB (ref 12.0–16.0)

## 2014-10-06 LAB — IPS N GONORRHOEA AND CHLAMYDIA BY PCR

## 2014-10-07 LAB — IPS PAP TEST WITH HPV

## 2014-10-16 ENCOUNTER — Encounter: Payer: Self-pay | Admitting: Obstetrics and Gynecology

## 2014-10-16 ENCOUNTER — Telehealth: Payer: Self-pay | Admitting: Emergency Medicine

## 2014-10-16 NOTE — Telephone Encounter (Signed)
Noted message from Dr. Quincy Simmonds, will close encounter.

## 2014-10-16 NOTE — Telephone Encounter (Signed)
Telephone call for triage created to discuss message with patient and disposition as appropriate.   

## 2014-10-16 NOTE — Telephone Encounter (Signed)
Chief Complaint  Patient presents with  . Advice Only    Patient sent mychart message-requires clinical triage     ===View-only below this line===   ----- Message -----    From: Rande Lawman    Sent: 10/16/2014 12:34 PM EDT      To: Arloa Koh, MD Subject: Visit Follow-Up Question  Good afternoon,  During my visit you told me to start my pills on my first day of my period. Well, my period came 7 days early while I was out of town. I'm now back at home, my period was the regular 3 days. Confirming I should now start them this Sunday,  Also, this is my fifth day with a headache OTC hasn't given me any relief. Tried Imitrex last month horrible side effects. Praying this will work. Thank you. Jennifer Cardenas

## 2014-10-16 NOTE — Telephone Encounter (Signed)
Spoke with patient. Reviewed mychart message with her. She states she started her cycle on 10/11/14. She wanted to know if she can start her pack today since she is back in town or wait until Sunday. Advised patient to start pack today, but to be sure to use back up method for birth control for one month.   Advised patient to continue headache treatment as recommended by headache wellness and PCP or if symptoms worsen or experiences "worse headache of life" to go to emergency room. Patient denies worst headache of her life, states this is usual "menstrual migraine for me". She will follow up with PCP to discuss ongoing headaches. Also advised to let us know how she is doing with lo-seasonique with headaches.   Advised patient that Dr. Quincy Simmonds will review message and if any new instructions will return call.   Patient agreeable.

## 2014-10-16 NOTE — Telephone Encounter (Signed)
I agree with your advice for the patient.  Keep follow up appointment for a 3 month recheck with me.

## 2014-11-12 ENCOUNTER — Ambulatory Visit (INDEPENDENT_AMBULATORY_CARE_PROVIDER_SITE_OTHER): Payer: 59 | Admitting: Adult Health

## 2014-11-12 ENCOUNTER — Encounter: Payer: Self-pay | Admitting: Adult Health

## 2014-11-12 VITALS — BP 110/76 | Temp 98.5°F | Ht 68.0 in | Wt 205.0 lb

## 2014-11-12 DIAGNOSIS — M25562 Pain in left knee: Secondary | ICD-10-CM

## 2014-11-12 MED ORDER — PREDNISONE 20 MG PO TABS: 20.0000 mg | ORAL_TABLET | Freq: Every day | ORAL | Status: DC

## 2014-11-12 NOTE — Progress Notes (Addendum)
Subjective:    Patient ID: Jennifer Cardenas, female    DOB: 1976-02-01, 39 y.o.   MRN: [id number]  HPI  39 year old obese female who presents to the office today for left knee pain. 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 She describes that discomfort as "pressure and it feels like a bone is moving around in there." She is able to apply light pressure to the leg when walking. She has been using heating packs that helps with the pain.   She had a cortisone shot in her left knee in early 2015.   Review of Systems  Constitutional: Negative.   Musculoskeletal: Positive for myalgias, joint swelling, arthralgias and gait problem.  All other systems reviewed and are negative.  Past Medical History  Diagnosis Date  . Herpes simplex     H/O HSV 1   and on leg  . STD (sexually transmitted disease)     HSV 1  . Hx of migraines   . Acne   . Hyperemesis     Social History   Social History  . Marital Status: Legally Separated    Spouse Name: N/A  . Number of Children: N/A  . Years of Education: N/A   Occupational History  . Not on file.   Social History Main Topics  . Smoking status: Never Smoker   . Smokeless tobacco: Never Used  . Alcohol Use: No     Comment: Occasionally   . Drug Use: No  . Sexual Activity: No   Other Topics Concern  . Not on file   Social History Narrative    Past Surgical History  Procedure Laterality Date  . Bunionectomy Bilateral     1996 and 1998  . Excision of right breast mass Right     benign    Family History  Problem Relation Age of Onset  . Bipolar disorder Mother   . Hepatitis C Mother     Allergies  Allergen Reactions  . Acne [Benzoyl Peroxide]     Current Outpatient Prescriptions on File Prior to Visit  Medication Sig Dispense Refill  . Levonorgestrel-Ethinyl Estradiol (LOSEASONIQUE) 0.1-0.02 & 0.01 MG tablet Take 1 tablet by mouth daily. 91 tablet 3  . Linaclotide (LINZESS) 145 MCG  CAPS capsule Take 1 capsule (145 mcg total) by mouth daily. 30 capsule 2  . valACYclovir (VALTREX) 1000 MG tablet Take 1 tablet (1,000 mg total) by mouth 2 (two) times daily. (Patient taking differently: Take 1,000 mg by mouth as needed. ) 20 tablet 3   No current facility-administered medications on file prior to visit.    BP 110/76 mmHg  Temp(Src) 98.5 F (36.9 C) (Oral)  Ht 5\' 8"  (1.727 m)  Wt 205 lb (92.987 kg)  BMI 31.18 kg/m2       Objective:   Physical Exam  Constitutional: She is oriented to person, place, and time. She appears well-developed and well-nourished. No distress.  Musculoskeletal: Normal range of motion. She exhibits edema and tenderness.  Anterior and posterior draw test negative.  Lachmans, Valgus, and Vargus negative.  Slight swelling to upper and lower areas of patella. No redness or warmth.  Pain with palpation to lateral aspect of patella.  She is able to bare weight and has steady gate.    Neurological: She is alert and oriented to person, place, and time.  Skin: Skin is warm and dry. No rash noted. She is not diaphoretic. No  erythema. No pallor.  Psychiatric: She has a normal mood and affect. Her behavior is normal. Judgment and thought content normal.  Nursing note and vitals reviewed.      Assessment & Plan:  1. Left knee pain - Unlikely any ligament or tendon tear or septic knee. Unlikely fracture. No Bakers Cyst felt. More likely arthritis of knee or mild bursitis.  - DG Knee 1-2 Views Left; Future -- predniSONE (DELTASONE) 20 MG tablet; Take 1 tablet (20 mg total) by mouth daily with breakfast.  Dispense: 9 tablet; Refill: 0 40mg  x 3 days, 20mg  x 3 days - Ice, Elevation and NSAIDs - Ace Wrap applied.  - Follow up if no improvement in 2-3 days.

## 2014-11-12 NOTE — Progress Notes (Signed)
Pre visit review using our clinic review tool, if applicable. No additional management support is needed unless otherwise documented below in the visit note. 

## 2014-11-12 NOTE — Patient Instructions (Signed)
It was great meeting you today!  I have placed the order for an x ray, I will follow up with you regarding your imaging.   Take the prednisone as directed  Day 1 40 mg Day 2 40 mg Day 3 40 mg Day 4 20 mg Day 5 20 mg Day 6 20mg   Use the ace wrap and apply ice to the area.

## 2014-11-13 ENCOUNTER — Ambulatory Visit (INDEPENDENT_AMBULATORY_CARE_PROVIDER_SITE_OTHER)
Admission: RE | Admit: 2014-11-13 | Discharge: 2014-11-13 | Disposition: A | Discharge status: Home / Self Care | Payer: 59 | Source: Ambulatory Visit | Attending: Adult Health | Admitting: Adult Health

## 2014-11-13 DIAGNOSIS — M25562 Pain in left knee: Secondary | ICD-10-CM | POA: Diagnosis not present

## 2014-11-13 NOTE — Telephone Encounter (Signed)
Updated patient on x ray of knee. No fracture.

## 2014-11-13 NOTE — Addendum Note (Signed)
Addended by: Apolinar Junes on: 11/13/2014 07:22 AM   Modules accepted: Miquel Dunn

## 2014-12-10 ENCOUNTER — Telehealth: Payer: Self-pay

## 2014-12-10 ENCOUNTER — Encounter: Payer: Self-pay | Admitting: Obstetrics and Gynecology

## 2014-12-10 NOTE — Telephone Encounter (Signed)
Non-Urgent Medical Question  Message [id number]   From  HOLLYANNE SCHLOESSER   To  Nunzio Cobbs, MD   Sent  12/10/2014 7:14 AM     Good morning,   Could you please send an [facility] prescription to [facility]? Thank you. Portal      Responsible Party    [facility] No one has taken responsibility for this message.     No actions have been taken on this message.     Left message to call [facility] at [phone].

## 2014-12-10 NOTE — Telephone Encounter (Signed)
Telephone encounter created regarding patient's mychart message.

## 2014-12-18 NOTE — Telephone Encounter (Signed)
Left message to call Kait[facility] at [phone]. 

## 2014-12-21 ENCOUNTER — Other Ambulatory Visit: Payer: Self-pay

## 2014-12-21 DIAGNOSIS — Z1231 Encounter for screening mammogram for malignant neoplasm of breast: Secondary | ICD-10-CM

## 2014-12-21 NOTE — Telephone Encounter (Signed)
Patient returning call.

## 2014-12-21 NOTE — Telephone Encounter (Signed)
Left message to call Kait[facility] at [phone]. 

## 2014-12-23 ENCOUNTER — Encounter: Payer: Self-pay | Admitting: Family

## 2014-12-23 ENCOUNTER — Ambulatory Visit (INDEPENDENT_AMBULATORY_CARE_PROVIDER_SITE_OTHER): Payer: 59 | Admitting: Family

## 2014-12-23 VITALS — BP 120/86 | HR 96 | Temp 98.8°F | Resp 20 | Ht 68.0 in | Wt 208.0 lb

## 2014-12-23 DIAGNOSIS — D509 Iron deficiency anemia, unspecified: Secondary | ICD-10-CM

## 2014-12-23 DIAGNOSIS — Z23 Encounter for immunization: Secondary | ICD-10-CM

## 2014-12-23 DIAGNOSIS — E663 Overweight: Secondary | ICD-10-CM

## 2014-12-23 DIAGNOSIS — M1712 Unilateral primary osteoarthritis, left knee: Secondary | ICD-10-CM

## 2014-12-23 MED ORDER — METHYLPREDNISOLONE ACETATE 40 MG/ML IJ SUSP: 40.0000 mg | Freq: Once | INTRAMUSCULAR | Status: DC

## 2014-12-23 MED ORDER — INTEGRA PLUS PO CAPS: 1.0000 | ORAL_CAPSULE | Freq: Every day | ORAL | Status: DC

## 2014-12-23 NOTE — Progress Notes (Signed)
Pre visit review using our clinic review tool, if applicable. No additional management support is needed unless otherwise documented below in the visit note. 

## 2014-12-23 NOTE — Progress Notes (Signed)
Subjective:    Patient ID: Jennifer Cardenas, female    DOB: 02/22/1976, 39 y.o.   MRN: [id number]  HPI 39 year old African-American female, nonsmoker with a history of osteoarthritis of the knees is in today with persistent left knee pain. She was seen by Georgina Snell last month and was prescribed prednisone that helped temporarily. She now has pain a 6 out of 10, worse with weightbearing and with working long shifts. 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 Has had joint injections in the past that have been successful. Last injection 1 year ago.  Requesting a prescription for Integra for anemia.  Review of Systems  Constitutional: Negative.   HENT: Negative.   Respiratory: Negative.   Cardiovascular: Negative.   Gastrointestinal: Negative.   Endocrine: Negative.   Genitourinary: Negative.   Musculoskeletal: Positive for arthralgias.       Left knee pain   Skin: Negative.   Allergic/Immunologic: Negative.   Neurological: Negative.   Psychiatric/Behavioral: Negative.    Past Medical History  Diagnosis Date  . Herpes simplex     H/O HSV 1   and on leg  . STD (sexually transmitted disease)     HSV 1  . Hx of migraines   . Acne   . Hyperemesis     Social History   Social History  . Marital Status: Legally Separated    Spouse Name: N/A  . Number of Children: N/A  . Years of Education: N/A   Occupational History  . Not on file.   Social History Main Topics  . Smoking status: Never Smoker   . Smokeless tobacco: Never Used  . Alcohol Use: No     Comment: Occasionally   . Drug Use: No  . Sexual Activity: No   Other Topics Concern  . Not on file   Social History Narrative    Past Surgical History  Procedure Laterality Date  . Bunionectomy Bilateral     1996 and 1998  . Excision of right breast mass Right     benign    Family History  Problem Relation Age of Onset  . Bipolar disorder Mother   . Hepatitis C Mother     Allergies  Allergen Reactions  . Acne  [Benzoyl Peroxide]     Current Outpatient Prescriptions on File Prior to Visit  Medication Sig Dispense Refill  . Levonorgestrel-Ethinyl Estradiol (LOSEASONIQUE) 0.1-0.02 & 0.01 MG tablet Take 1 tablet by mouth daily. 91 tablet 3  . Linaclotide (LINZESS) 145 MCG CAPS capsule Take 1 capsule (145 mcg total) by mouth daily. 30 capsule 2  . valACYclovir (VALTREX) 1000 MG tablet Take 1 tablet (1,000 mg total) by mouth 2 (two) times daily. (Patient taking differently: Take 1,000 mg by mouth as needed. ) 20 tablet 3   No current facility-administered medications on file prior to visit.    BP 120/86 mmHg  Pulse 96  Temp(Src) 98.8 F (37.1 C) (Oral)  Resp 20  Ht 5\' 8"  (1.727 m)  Wt 208 lb (94.348 kg)  BMI 31.63 kg/m2  SpO2 98%chart    Objective:   Physical Exam  Constitutional: She appears well-developed and well-nourished.  Neck: Normal range of motion. Neck supple.  Cardiovascular: Normal rate, regular rhythm and normal heart sounds.   Pulmonary/Chest: Effort normal and breath sounds normal.  Musculoskeletal: She exhibits tenderness. She exhibits no edema.  Left knee: tender to palpation  Neurological: She is alert.  Skin: Skin is warm and dry.  Psychiatric: She has a  normal mood and affect.      Left Knee: Informed consent obtained and the patient's knee was prepped with betadine. Local anesthesia was obtained with topical spray. Then 40 mg of Depo-Medrol and 1/2 cc of lidocaine was injected into the joint space. The patient tolerated the procedure without complications. Post injection care discussed with patient.    Assessment & Plan:  Minami was seen today for follow-up and knee pain.  Diagnoses and all orders for this visit:  Primary osteoarthritis of left knee -     methylPREDNISolone acetate (DEPO-MEDROL) injection 40 mg; Inject 1 mL (40 mg total) into the articular space once. -     Ambulatory referral to Orthopedic Surgery  Iron deficiency anemia  Over  weight  Other orders -     Flu Vaccine QUAD 36+ mos IM -     FeFum-FePoly-FA-B Cmp-C-Biot (INTEGRA PLUS) CAPS; Take 1 capsule by mouth daily.   Call the office with any questions or concerns.Recheck in 6 months and sooner as needed.

## 2014-12-23 NOTE — Patient Instructions (Signed)

## 2014-12-24 NOTE — Telephone Encounter (Signed)
Spoke with patient. Patient states that she was seen with her PCP who prescribed her medication for her low iron levels. No longer needs rx from our office. Advised if she needs anything to give our office a call. Patient is agreeable.  Routing to Florean Hoobler for final review. Patient agreeable to disposition. Will close encounter.

## 2015-01-06 ENCOUNTER — Ambulatory Visit: Payer: 59 | Admitting: Obstetrics and Gynecology

## 2015-01-15 ENCOUNTER — Ambulatory Visit
Admission: RE | Admit: 2015-01-15 | Discharge: 2015-01-15 | Disposition: A | Discharge status: Home / Self Care | Payer: 59 | Source: Ambulatory Visit

## 2015-01-15 DIAGNOSIS — Z1231 Encounter for screening mammogram for malignant neoplasm of breast: Secondary | ICD-10-CM

## 2015-01-20 ENCOUNTER — Telehealth: Payer: Self-pay | Admitting: Family

## 2015-01-20 ENCOUNTER — Encounter: Payer: Self-pay | Admitting: Obstetrics and Gynecology

## 2015-01-20 ENCOUNTER — Ambulatory Visit (INDEPENDENT_AMBULATORY_CARE_PROVIDER_SITE_OTHER): Payer: 59 | Admitting: Obstetrics and Gynecology

## 2015-01-20 VITALS — BP 110/76 | HR 80 | Ht 68.0 in | Wt 209.0 lb

## 2015-01-20 DIAGNOSIS — R519 Headache, unspecified: Secondary | ICD-10-CM

## 2015-01-20 DIAGNOSIS — R51 Headache: Secondary | ICD-10-CM

## 2015-01-20 DIAGNOSIS — L709 Acne, unspecified: Secondary | ICD-10-CM

## 2015-01-20 MED ORDER — LEVONORGEST-ETH ESTRAD 91-DAY 0.15-0.03 MG PO TABS
1.0000 | ORAL_TABLET | Freq: Every day | ORAL | Status: DC
Start: 2015-01-20 — End: 2015-10-19

## 2015-01-20 NOTE — Progress Notes (Signed)
Patient ID: Jennifer Cardenas, female   DOB: August 26, 1975, 39 y.o.   MRN: [id number] GYNECOLOGY  VISIT   HPI: 39 y.o.   Legally Separated  African American  female   [phone] with Patient's last menstrual period was 01/11/2015 (exact date).   here for 3 month follow-up after starting LoSeasonique.  Patient states she is still having migraines. Headaches are not worse.  No having vomiting but is nauseous. Flows for longer on her OCPs than she does off her cycle. September menses was 2.5 weeks long with mild migraine.  Menses last week lasted one week and had moderate migraine.   Acne is improved on OCP.  Does not want to go back on the [place].   Has not been seen at the [facility].  Has menstrual migraines and migraines.  No aura.  Working with PCP only so far.   GYNECOLOGIC HISTORY: Patient's last menstrual period was 01/11/2015 (exact date). Contraception:Condoms and LoSeasonique Menopausal hormone therapy: n/a Last mammogram: 01-18-15 Density Cat.B/Neg/BiRads1:[facility]. Last pap smear: 10-02-14 Neg:Neg HR HPV        OB History    Gravida Para Term Preterm AB TAB SAB Ectopic Multiple Living   2 2 2       2          Patient Active Problem List   Diagnosis Date Noted  . Chronic constipation 04/29/2014  . Herpes simplex 12/26/2012  . Migraine headache 12/13/2012    Past Medical History  Diagnosis Date  . Herpes simplex     H/O HSV 1   and on leg  . STD (sexually transmitted disease)     HSV 1  . Hx of migraines   . Acne   . Hyperemesis     Past Surgical History  Procedure Laterality Date  . Bunionectomy Bilateral     1996 and 1998  . Excision of right breast mass Right     benign    Current Outpatient Prescriptions  Medication Sig Dispense Refill  . FeFum-FePoly-FA-B Cmp-C-Biot (INTEGRA PLUS) CAPS Take 1 capsule by mouth daily. 30 capsule 5  . Levonorgestrel-Ethinyl Estradiol (LOSEASONIQUE) 0.1-0.02 & 0.01 MG tablet Take 1 tablet by  mouth daily. 91 tablet 3  . Linaclotide (LINZESS) 145 MCG CAPS capsule Take 1 capsule (145 mcg total) by mouth daily. 30 capsule 2  . valACYclovir (VALTREX) 1000 MG tablet Take 1 tablet (1,000 mg total) by mouth 2 (two) times daily. (Patient taking differently: Take 1,000 mg by mouth as needed. ) 20 tablet 3   No current facility-administered medications for this visit.     ALLERGIES: Acne  Family History  Problem Relation Age of Onset  . Bipolar disorder Mother   . Hepatitis C Mother     Social History   Social History  . Marital Status: Legally Separated    Spouse Name: N/A  . Number of Children: N/A  . Years of Education: N/A   Occupational History  . Not on file.   Social History Main Topics  . Smoking status: Never Smoker   . Smokeless tobacco: Never Used  . Alcohol Use: No     Comment: Occasionally   . Drug Use: No  . Sexual Activity: No     Comment: LoSeasonique   Other Topics Concern  . Not on file   Social History Narrative    ROS:  Pertinent items are noted in HPI.  PHYSICAL EXAMINATION:    BP 110/76 mmHg  Pulse 80  Ht  5\' 8"  (1.727 m)  Wt 209 lb (94.802 kg)  BMI 31.79 kg/m2  LMP 01/11/2015 (Exact Date)    General appearance: alert, cooperative and appears stated age   ASSESSMENT  Menstrual migraines and migraines without aura.  Adult acne.  Breakthrough bleeding on LoSeasonique.   PLAN  Counseled regarding headaches and menstrual headaches.  Will stop LoSeasonique and start Seasonal.  See orders.  Will skip placebo pills all together.  Told that she may still have breakthrough bleeding.  Understands that she would not be a candidate for combined OCPs if she has migraines with aura.  Patient will return to her PCP for referral to [facility] or neurologist.  Follow up for annual exam and prn.   An After Visit Summary was printed and given to the patient.  __15____ minutes face to face time of which over 50% was spent in  counseling.

## 2015-01-22 NOTE — Telephone Encounter (Signed)
error 

## 2015-02-06 ENCOUNTER — Encounter: Payer: Self-pay | Admitting: Family

## 2015-02-08 NOTE — Telephone Encounter (Signed)
What should i do with this? Who do i talk to about it?

## 2015-02-11 ENCOUNTER — Other Ambulatory Visit: Payer: Self-pay | Admitting: Adult Health

## 2015-02-11 DIAGNOSIS — G8929 Other chronic pain: Secondary | ICD-10-CM

## 2015-02-11 DIAGNOSIS — R51 Headache: Principal | ICD-10-CM

## 2015-04-28 ENCOUNTER — Other Ambulatory Visit: Payer: Self-pay | Admitting: Family

## 2015-04-28 MED FILL — LINZESS 145 MCG CAPSULE: 145 | 30 days supply | Qty: 30 | Fill #0

## 2015-04-30 MED FILL — SETLAKIN 0.15 MG-0.03 MG TA: 0.15-0.03 | 84 days supply | Qty: 91 | Fill #1

## 2015-06-23 ENCOUNTER — Encounter: Payer: Self-pay | Admitting: Family

## 2015-07-26 MED FILL — SETLAKIN 0.15 MG-0.03 MG TA: 0.15-0.03 | 84 days supply | Qty: 91 | Fill #2

## 2015-08-31 ENCOUNTER — Ambulatory Visit (INDEPENDENT_AMBULATORY_CARE_PROVIDER_SITE_OTHER)
Admission: RE | Admit: 2015-08-31 | Discharge: 2015-08-31 | Disposition: A | Discharge status: Home / Self Care | Payer: 59 | Source: Ambulatory Visit | Attending: Family Medicine | Admitting: Family Medicine

## 2015-08-31 ENCOUNTER — Ambulatory Visit (INDEPENDENT_AMBULATORY_CARE_PROVIDER_SITE_OTHER): Payer: 59 | Admitting: Family Medicine

## 2015-08-31 ENCOUNTER — Encounter: Payer: Self-pay | Admitting: Family Medicine

## 2015-08-31 ENCOUNTER — Other Ambulatory Visit: Payer: Self-pay

## 2015-08-31 ENCOUNTER — Other Ambulatory Visit (INDEPENDENT_AMBULATORY_CARE_PROVIDER_SITE_OTHER): Payer: 59

## 2015-08-31 VITALS — BP 122/80 | HR 85 | Ht 67.0 in | Wt 214.0 lb

## 2015-08-31 DIAGNOSIS — M1711 Unilateral primary osteoarthritis, right knee: Secondary | ICD-10-CM

## 2015-08-31 DIAGNOSIS — M25562 Pain in left knee: Secondary | ICD-10-CM

## 2015-08-31 DIAGNOSIS — M25561 Pain in right knee: Secondary | ICD-10-CM

## 2015-08-31 DIAGNOSIS — M13861 Other specified arthritis, right knee: Secondary | ICD-10-CM | POA: Diagnosis not present

## 2015-08-31 DIAGNOSIS — M25569 Pain in unspecified knee: Secondary | ICD-10-CM | POA: Diagnosis not present

## 2015-08-31 DIAGNOSIS — M25461 Effusion, right knee: Secondary | ICD-10-CM | POA: Diagnosis not present

## 2015-08-31 DIAGNOSIS — M659 Synovitis and tenosynovitis, unspecified: Secondary | ICD-10-CM | POA: Insufficient documentation

## 2015-08-31 LAB — URIC ACID: Uric Acid, Serum: 3.3 mg/dL (ref 2.4–7.0)

## 2015-08-31 LAB — RHEUMATOID FACTOR: Rhuematoid fact SerPl-aCnc: <10 IU/mL (ref <=14)

## 2015-08-31 LAB — SEDIMENTATION RATE: Sed Rate: 11 mm/hr (ref 0–20)

## 2015-08-31 LAB — CK: CK TOTAL: 80 U/L (ref 7–177)

## 2015-08-31 MED ORDER — DICLOFENAC SODIUM 2 % TD SOLN: 2.0000 "application " | Freq: Two times a day (BID) | TRANSDERMAL | Status: DC

## 2015-08-31 NOTE — Progress Notes (Signed)
Pre visit review using our clinic review tool, if applicable. No additional management support is needed unless otherwise documented below in the visit note. 

## 2015-08-31 NOTE — Patient Instructions (Signed)
Good to see you  Xray and labs downstairs today .  Ice 20 minutes 2 times daily. Usually after activity and before bed. Exercises 3 times a week.  pennsaid pinkie amount topically 2 times daily as needed.  Vitamin D 2000 IU daily Turmeric 500mg  twice daily  We injected knee today and should help for a while.  Good shoes with rigid bottom.  Jalene Mullet, Merrell or New balance greater then 700 See me again in 3 weeks.

## 2015-08-31 NOTE — Assessment & Plan Note (Signed)
Patient given injection today and tolerated the procedure well. We discussed icing regimen and home exercises. We discussed with activities to do in which ones to avoid. Patient work with Product/process development scientist to learn them in greater detail. I do believe with patient having also a synovitis that further laboratory workup would be beneficial. Patient has had knee pain and does have fairly significant arthritis based on her age. Patient will try topical anti-inflammatories. Patient come back and see me again in 3 weeks.

## 2015-08-31 NOTE — Progress Notes (Signed)
[facility] [address] [place], [place] [address] Phone: [phone] Subjective:    I'm seeing this patient by the request  of:  Kennyth Arnold, FNP   CC: Right knee pain  [id number] Jennifer Cardenas is a 40 y.o. female coming in with complaint of Right knee pain. Patient is had this pain for multiple months but seems to be worsening over the course of weeks. Patient's months ago did have more of a left knee pain and was given an injection with some improvement. That x-rays of the left knee showed mild osteophytic changes. Patient states the right knee seems to have intermittent swelling. Symptoms related can potentially give out on her. Patient is concerned because they feel like they swell for no reason. Seems worse after sitting for long amount time and then standing. Worse with stairs. Not responding well to over-the-counter medications. Sometimes feels like the knee wants to lock on her. Rates the severity of pain as 7 out of 10. Possibly worsening and affecting job.     Past Medical History  Diagnosis Date  . Herpes simplex     H/O HSV 1   and on leg  . STD (sexually transmitted disease)     HSV 1  . Hx of migraines   . Acne   . Hyperemesis    Past Surgical History  Procedure Laterality Date  . Bunionectomy Bilateral     1996 and 1998  . Excision of right breast mass Right     benign   Social History   Social History  . Marital Status: Legally Separated    Spouse Name: N/A  . Number of Children: N/A  . Years of Education: N/A   Social History Main Topics  . Smoking status: Never Smoker   . Smokeless tobacco: Never Used  . Alcohol Use: No     Comment: Occasionally   . Drug Use: No  . Sexual Activity: No     Comment: LoSeasonique   Other Topics Concern  . None   Social History Narrative   Allergies  Allergen Reactions  . Acne [Benzoyl Peroxide]    Family History  Problem Relation Age of Onset  . Bipolar disorder Mother    . Hepatitis C Mother     Past medical history, social, surgical and family history all reviewed in electronic medical record.  No pertanent information unless stated regarding to the chief complaint.   Review of Systems: No headache, visual changes, nausea, vomiting, diarrhea, constipation, dizziness, abdominal pain, skin rash, fevers, chills, night sweats, weight loss, swollen lymph nodes, body aches, joint swelling, muscle aches, chest pain, shortness of breath, mood changes.   Objective Blood pressure 122/80, pulse 85, height 5\' 7"  (1.702 m), weight 214 lb (97.07 kg), SpO2 98 %.  General: No apparent distress alert and oriented x3 mood and affect normal, dressed appropriately.  HEENT: Pupils equal, extraocular movements intact  Respiratory: Patient's speak in full sentences and does not appear short of breath  Cardiovascular: No lower extremity edema, non tender, no erythema  Skin: Warm dry intact with no signs of infection or rash on extremities or on axial skeleton.  Abdomen: Soft nontender  Neuro: Cranial nerves II through XII are intact, neurovascularly intact in all extremities with 2+ DTRs and 2+ pulses.  Lymph: No lymphadenopathy of posterior or anterior cervical chain or axillae bilaterally.  Gait normal with good balance and coordination.  MSK:  Non tender with full range of motion  and good stability and symmetric strength and tone of shoulders, elbows, wrist, hip, and ankles bilaterally.  Knee: Right Trace effusion noted on the knee. Tender over the medial joint line and the patellofemoral joint ROM full in flexion and extension and lower leg rotation. Ligaments with solid consistent endpoints including ACL, PCL, LCL, MCL. Mildly positive Mcmurray's, Apley's, and Thessalonian tests. painful patellar compression. Lateral tracking noted as well. Patellar glide with moderate crepitus. Patellar and quadriceps tendons unremarkable. Hamstring and quadriceps strength is normal.    Contralateral knee tender to palpation over the medial joint line as well.  MSK US performed of: Right knee This study was ordered, performed, and interpreted by Charlann Boxer D.O.  Knee: All structures visualized. Narrowing of the patellofemoral joint noted. Large synovitis noted Patellar Tendon unremarkable on long and transverse views without effusion. No abnormality of prepatellar bursa. LCL and MCL unremarkable on long and transverse views. No abnormality of origin of medial or lateral head of the gastrocnemius.  IMPRESSION:  Patellofemoral arthritis with synovitis noted  After informed written and verbal consent, patient was seated on exam table. Right knee was prepped with alcohol swab and utilizing anterolateral approach, patient's right knee space was injected with 4:1  marcaine 0.5%: Kenalog 40mg /dL. Patient tolerated the procedure well without immediate complications.   Impression and Recommendations:     This case required medical decision making of moderate complexity.      Note: This dictation was prepared with Dragon dictation along with smaller phrase technology. Any transcriptional errors that result from this process are unintentional.

## 2015-09-01 LAB — ANTI-NUCLEAR AB-TITER (ANA TITER): ANA Titer 1: 1:160 {titer} — ABNORMAL HIGH

## 2015-09-01 LAB — ANTI-DNA ANTIBODY, DOUBLE-STRANDED: ds DNA Ab: <1 IU/mL

## 2015-09-01 LAB — ANA: Anti Nuclear Antibody(ANA): POSITIVE — AB

## 2015-09-01 LAB — CYCLIC CITRUL PEPTIDE ANTIBODY, IGG

## 2015-10-19 ENCOUNTER — Ambulatory Visit (INDEPENDENT_AMBULATORY_CARE_PROVIDER_SITE_OTHER): Payer: 59 | Admitting: Family Medicine

## 2015-10-19 ENCOUNTER — Encounter: Payer: Self-pay | Admitting: Family Medicine

## 2015-10-19 VITALS — BP 110/64 | HR 97 | Temp 98.6°F | Ht 67.0 in | Wt 207.0 lb

## 2015-10-19 DIAGNOSIS — R110 Nausea: Secondary | ICD-10-CM

## 2015-10-19 DIAGNOSIS — K5900 Constipation, unspecified: Secondary | ICD-10-CM | POA: Diagnosis not present

## 2015-10-19 DIAGNOSIS — R140 Abdominal distension (gaseous): Secondary | ICD-10-CM | POA: Diagnosis not present

## 2015-10-19 NOTE — Patient Instructions (Signed)
BEFORE YOU LEAVE: -follow up: schedule a new patient visit with Gwynneth Albright or Dr. Martinique in about 1 month  Fiber supplement every morning before breakfast - metameucil or citracel. If no BM for 2 days, straining or hard stools start mirilax and take 1-2 times daily in water until soft regular stools.  Try trial off dairy for 2 weeks, then trial off gluten for 2 weeks to see if this helps your symptoms.  Follow up sooner if worsening or new symptoms.

## 2015-10-19 NOTE — Progress Notes (Signed)
HPI:  Jennifer Cardenas is a pleasant 40 year old R in with a past medical history significant for chronic constipation, irritable bowel syndrome and chronic intermittent nausea here for an acute visit for right upper quadrant discomfort. She had no bowel movements for 1 week last week. 3 days ago she noticed some bloating, and felt like she had pressure in the RUQ and had some nausea. She took some linzess and did empty her bowels and started her menstrual cycle. Now no pain or discomfort in the abdomen. She does not do anything for her chronic constipation and reports going a week or more between BM, then with straining and hard stools is normal for her. Denies: fevers, recurrent emesis, diarrhea, melena, hematochezia or abd pain.  ROS: See pertinent positives and negatives per HPI.  Past Medical History:  Diagnosis Date  . Acne   . Herpes simplex    H/O HSV 1   and on leg  . Hx of migraines   . Hyperemesis   . STD (sexually transmitted disease)    HSV 1    Past Surgical History:  Procedure Laterality Date  . BUNIONECTOMY Bilateral    1996 and 1998  . excision of right breast mass Right    benign    Family History  Problem Relation Age of Onset  . Bipolar disorder Mother   . Hepatitis C Mother     Social History   Social History  . Marital status: Legally Separated    Spouse name: N/A  . Number of children: N/A  . Years of education: N/A   Social History Main Topics  . Smoking status: Never Smoker  . Smokeless tobacco: Never Used  . Alcohol use No     Comment: Occasionally   . Drug use: No  . Sexual activity: No     Comment: LoSeasonique   Other Topics Concern  . None   Social History Narrative  . None     Current Outpatient Prescriptions:  .  Diclofenac Sodium (PENNSAID) 2 % SOLN, Place 2 application onto the skin 2 (two) times daily., Disp: 112 g, Rfl: 3 .  FeFum-FePoly-FA-B Cmp-C-Biot (INTEGRA PLUS) CAPS, Take 1 capsule by mouth daily., Disp: 30 capsule,  Rfl: 5 .  LINZESS 145 MCG CAPS capsule, TAKE 1 CAPSULE BY MOUTH ONCE DAILY, Disp: 30 capsule, Rfl: 2 .  valACYclovir (VALTREX) 1000 MG tablet, Take 1 tablet (1,000 mg total) by mouth 2 (two) times daily. (Patient taking differently: Take 1,000 mg by mouth as needed. ), Disp: 20 tablet, Rfl: 3  EXAM:  Vitals:   10/19/15 1327  BP: 110/64  Pulse: 97  Temp: 98.6 F (37 C)    Body mass index is 32.42 kg/m.  GENERAL: vitals reviewed and listed above, alert, oriented, appears well hydrated and in no acute distress  HEENT: atraumatic, conjunttiva clear, no obvious abnormalities on inspection of external nose and ears  NECK: no obvious masses on inspection  LUNGS: clear to auscultation bilaterally, no wheezes, rales or rhonchi, good air movement  CV: HRRR, no peripheral edema  ABD: BS+, soft, NTTP, no rebound or guarding  MS: moves all extremities without noticeable abnormality  PSYCH: pleasant and cooperative, no obvious depression or anxiety  ASSESSMENT AND PLAN:  Discussed the following assessment and plan:  Constipation, unspecified constipation type  Nausea  Bloating  -likely a combination of her constipation and menstrual symptoms -advised fiber supplement, bowel regimen, trial fodmaps (start with dairy and gluten) diet and new patient visit/follow up  in 2-4 weeks -advised may need labs/US/GI eval if persistent issues -Patient advised to return or notify a doctor immediately if symptoms worsen or persist or new concerns arise.  Patient Instructions  BEFORE YOU LEAVE: -follow up: schedule a new patient visit with Gwynneth Albright or Dr. Martinique in about 1 month  Fiber supplement every morning before breakfast - metameucil or citracel. If no BM for 2 days, straining or hard stools start mirilax and take 1-2 times daily in water until soft regular stools.  Try trial off dairy for 2 weeks, then trial off gluten for 2 weeks to see if this helps your symptoms.  Follow up  sooner if worsening or new symptoms.    Colin Benton R., DO

## 2015-10-19 NOTE — Progress Notes (Signed)
Pre visit review using our clinic review tool, if applicable. No additional management support is needed unless otherwise documented below in the visit note. 

## 2015-10-26 MED FILL — FOLIVANE-PLUS CAPSULE: 30 days supply | Qty: 30 | Fill #1

## 2015-11-02 ENCOUNTER — Ambulatory Visit (INDEPENDENT_AMBULATORY_CARE_PROVIDER_SITE_OTHER): Payer: 59 | Admitting: Nurse Practitioner

## 2015-11-02 ENCOUNTER — Encounter: Payer: Self-pay | Admitting: Nurse Practitioner

## 2015-11-02 VITALS — BP 108/70 | HR 80 | Resp 16 | Ht 67.0 in | Wt 207.0 lb

## 2015-11-02 DIAGNOSIS — N760 Acute vaginitis: Secondary | ICD-10-CM

## 2015-11-02 NOTE — Progress Notes (Signed)
40 y.o. Legally Separated African American female [phone] here with complaint of vaginal symptoms of itching, burning, and increase discharge. Describes discharge as none. Onset of symptoms mid July. Denies new personal products.States that she does have vaginal dryness. No STD concerns, same partner X 1 yr. Urinary symptoms None . Contraception is none but not SA since stopping the pill. Stopped Seasonale 7/23.  O:  Healthy female WDWN Affect: normal, orientation x 3  Exam: no distress Abdomen: soft and non tender Lymph node: no enlargement or tenderness Pelvic exam: External genital: normal female BUS: negative Vagina: white thin discharge noted.  Affirm taken. Cervix: normal, non tender, no CMT Uterus: normal, non tender Adnexa:normal, non tender, no masses or fullness noted    A: R/O Vaginitis   P: Discussed findings of vaginitis and etiology. Discussed Aveeno or baking soda sitz bath for comfort. Avoid moist clothes or pads for extended period of time. If working out in gym clothes or swim suits for long periods of time change underwear or bottoms of swimsuit if possible. Olive Oil/Coconut Oil use for skin protection prior to activity can be used to external skin.  Rx: will hold for test results - if BV will need Diflucan as back up  Follow with Affirm  RV prn

## 2015-11-02 NOTE — Patient Instructions (Signed)

## 2015-11-02 NOTE — Progress Notes (Signed)
Reviewed personally.  M. Suzanne Mackenzy Eisenberg, MD.  

## 2015-11-03 ENCOUNTER — Other Ambulatory Visit: Payer: Self-pay | Admitting: Nurse Practitioner

## 2015-11-03 LAB — WET PREP BY MOLECULAR PROBE
Candida species: NEGATIVE
Gardnerella vaginalis: POSITIVE — AB
Trichomonas vaginosis: NEGATIVE

## 2015-11-03 MED ORDER — METRONIDAZOLE 0.75 % VA GEL: 1.0000 | Freq: Every day | VAGINAL | 0 refills | Status: DC

## 2015-11-03 MED ORDER — FLUCONAZOLE 150 MG PO TABS: 150.0000 mg | ORAL_TABLET | Freq: Once | ORAL | 0 refills | Status: AC

## 2015-11-03 MED FILL — FLUCONAZOLE 150 MG TABLET: 150 | 2 days supply | Qty: 2 | Fill #0

## 2015-11-03 MED FILL — metroNIDAZOLE 0.75 % GEL: 0.75 | 5 days supply | Qty: 70 | Fill #0

## 2015-11-09 ENCOUNTER — Telehealth: Payer: Self-pay | Admitting: Emergency Medicine

## 2015-11-09 NOTE — Telephone Encounter (Signed)
Called pt and left message for pt to return call to office to discuss establishing care.  Pt called in with concerns of dizziness after receiving MMR injection.(no record)   [facility] informed pt to go to the ED, pt has yet to follow up in ED    PLEASE NOTE: All timestamps contained within this report are represented as [place] Standard Time. CONFIDENTIALTY NOTICE: This fax transmission is intended only for the addressee. It contains information that is legally privileged, confidential or otherwise protected from use or disclosure. If you are not the intended recipient, you are strictly prohibited from reviewing, disclosing, copying using or disseminating any of this information or taking any action in reliance on or regarding this information. If you have received this fax in error, please notify us immediately by telephone so that we can arrange for its return to [place]. Phone: [phone], Toll-Free: [phone], Fax: [phone] Page: 1 of 2 Call Id: [id number] [facility] - Client [place] Patient Name: Jennifer Cardenas Gender: Female DOB: 15-Sep-1975 Age: 40 Y 27 D Return Phone Number: [phone] (Primary) Address: City/State/Zip: [place] Client [facility] - Client Client Site [place] - Night Physician [facility] - PHYSICIAN, UNKNOWN Contact Type Call Who Is Calling Patient / Member / Family / Caregiver Call Type Triage / Clinical Relationship To Patient Self Return Phone Number [phone] (Primary) Chief Complaint Dizziness Reason for Call Symptomatic / Request for [place] states had a MMR vaccination today this morning and every time she moves she becomes dizzy. PreDisposition InappropriateToAsk Translation No Nurse Assessment Nurse: Tamala Julian, RN, Joelene Millin Date/Time (Eastern Time): 11/08/2015 7:08:06 PM Confirm and document reason for  call. If symptomatic, describe symptoms. You must click the next button to save text entered. ---Caller states had a MMR vaccination today this morning (around 9AM) and every time she moves she becomes dizzy. Has the patient traveled out of the country within the last 30 days? ---No Does the patient have any new or worsening symptoms? ---Yes Will a triage be completed? ---Yes Related visit to physician within the last 2 weeks? ---No Does the PT have any chronic conditions? (i.e. diabetes, asthma, etc.) ---Yes List chronic conditions. ---Migraines Is the patient pregnant or possibly pregnant? (Ask all females between the ages of 62-55) ---No Is this a behavioral health or substance abuse call? ---No Guidelines Guideline Title Affirmed Question Affirmed Notes Nurse Date/Time (Eastern Time) Dizziness - Vertigo SEVERE dizziness (vertigo) (e.g., unable to walk without assistance) Tamala Julian, RN, Joelene Millin 11/08/2015 7:12:43 PM Disp. Time Eilene Ghazi Time) Disposition Final User 11/08/2015 7:16:39 PM Go to ED Now (or PCP triage) Yes Tamala Julian, RN, Joelene Millin PLEASE NOTE: All timestamps contained within this report are represented as [place] Standard Time. CONFIDENTIALTY NOTICE: This fax transmission is intended only for the addressee. It contains information that is legally privileged, confidential or otherwise protected from use or disclosure. If you are not the intended recipient, you are strictly prohibited from reviewing, disclosing, copying using or disseminating any of this information or taking any action in reliance on or regarding this information. If you have received this fax in error, please notify us immediately by telephone so that we can arrange for its return to [place]. Phone: [phone], Toll-Free: [phone], Fax: [phone] Page: 2 of 2 Call Id: [id number] Caller Understands: Yes Disagree/Comply: Comply Care Advice Given Per Guideline GO TO ED NOW (OR PCP TRIAGE): * IF NO PCP  TRIAGE: You need to be  seen. [facility] to the [facility] at [facility] within the next hour. Leave as soon as you can. NOTE TO TRIAGER - DRIVING: * Another adult should drive. CARE ADVICE given per Dizziness - Vertigo (Adult) guideline. Referrals [facility] - ED

## 2015-11-10 ENCOUNTER — Ambulatory Visit (INDEPENDENT_AMBULATORY_CARE_PROVIDER_SITE_OTHER): Payer: 59 | Admitting: Family Medicine

## 2015-11-10 VITALS — BP 90/70 | HR 115 | Temp 98.3°F | Ht 67.0 in | Wt 208.6 lb

## 2015-11-10 DIAGNOSIS — H8110 Benign paroxysmal vertigo, unspecified ear: Secondary | ICD-10-CM | POA: Diagnosis not present

## 2015-11-10 NOTE — Progress Notes (Signed)
Subjective:     Patient ID: Jennifer Cardenas, female   DOB: January 06, 1976, 40 y.o.   MRN: [id number]  HPI Patient seen as a work in with vertigo symptoms. She states this past Monday she went to health department or see measles [facility] vaccination. That night she had first episode of transient vertigo. Symptoms were better the next day. Today when she first woke up she had recurrence after getting up and turning to the right side. She called 911. Vital signs are stable. Blood sugar 79. Her symptoms are improved at this point if she still feels slightly off balance. She's not had any true ataxia. No focal weakness.  She relates some mild reading in the left ear but no hearing loss whatsoever. No history of Mnire's disease. No family history of Mnire's. No headaches. No nausea or vomiting. No recent appetite or weight changes. No confusion. No recent head injury.  Past Medical History:  Diagnosis Date  . Acne   . Herpes simplex    H/O HSV 1   and on leg  . Hx of migraines   . Hyperemesis   . STD (sexually transmitted disease)    HSV 1   Past Surgical History:  Procedure Laterality Date  . BUNIONECTOMY Bilateral    1996 and 1998  . excision of right breast mass Right    benign    reports that she has never smoked. She has never used smokeless tobacco. She reports that she does not drink alcohol or use drugs. family history includes Bipolar disorder in her mother; Hepatitis C in her mother. Allergies  Allergen Reactions  . Acne [Benzoyl Peroxide]        Review of Systems  Constitutional: Negative for chills, fatigue and fever.  HENT: Positive for tinnitus. Negative for hearing loss.   Eyes: Negative for visual disturbance.  Respiratory: Negative for cough, chest tightness, shortness of breath and wheezing.   Cardiovascular: Negative for chest pain, palpitations and leg swelling.  Genitourinary: Negative for dysuria.  Neurological: Positive for dizziness. Negative for  seizures, syncope, weakness, light-headedness and headaches.  Psychiatric/Behavioral: Negative for confusion.       Objective:   Physical Exam  Constitutional: She is oriented to person, place, and time. She appears well-developed and well-nourished.  HENT:  Right Ear: External ear normal.  Left Ear: External ear normal.  Mouth/Throat: Oropharynx is clear and moist.  Eyes: Pupils are equal, round, and reactive to light.  Neck: Neck supple.  Cardiovascular: Normal rate and regular rhythm.   Pulmonary/Chest: Effort normal and breath sounds normal. No respiratory distress. She has no wheezes. She has no rales.  Lymphadenopathy:    She has no cervical adenopathy.  Neurological: She is alert and oriented to person, place, and time. No cranial nerve deficit. Coordination normal.  no focal strength deficits. Normal cerebellar function.  Skin: No rash noted.  Psychiatric: She has a normal mood and affect. Her behavior is normal.       Assessment:     Patient presents with vertigo with very transient symptoms associated with mild left tenderness but no hearing changes whatsoever. She has nonfocal neurologic exam. Explained we did not think this is likely related to measles mumps rubella vaccination but cannot be totally sure. She's not had any rash fever or other concerns.  Suspect benign peripheral positional vertigo. Symptoms worse today with lying supine head turned to the left    Plan:     -Observe for now. Handout given. -Follow-up promptly for  any new neurologic symptoms or for any hearing loss whatsoever. -Touch base by next week if symptoms not fully resolving.  Eulas Post MD [facility] at [facility]

## 2015-11-10 NOTE — Progress Notes (Signed)
Subjective:     Patient ID: Jennifer Cardenas, female   DOB: 02-Aug-1975, 40 y.o.   MRN: [id number]  HPI  Pt seen as a work in with chief complaint of dizziness and feeling off balance.  Descried more as a vertigo  No light headed or presyncopal symptoms.  No hearing changes.  She became quite anxious and EMS was called.  Vital signs were normal.  CBG 79.  Symptoms are some improved at this time.  No major nausea and no vomiting. No headaches.  No focal weakness. She is not sure, but thinks symptoms worse with head turned to the right side.   No hx of recurrent dizziness or tinnitus. No FH of Meniere's  No speech changes, visual changes, focal weakness, or ataxia.   Past Medical History:  Diagnosis Date  . Acne   . Herpes simplex    H/O HSV 1   and on leg  . Hx of migraines   . Hyperemesis   . STD (sexually transmitted disease)    HSV 1   Past Surgical History:  Procedure Laterality Date  . BUNIONECTOMY Bilateral    1996 and 1998  . excision of right breast mass Right    benign    reports that she has never smoked. She has never used smokeless tobacco. She reports that she does not drink alcohol or use drugs. family history includes Bipolar disorder in her mother; Hepatitis C in her mother. Allergies  Allergen Reactions  . Acne [Benzoyl Peroxide]       Review of Systems  Constitutional: Negative for chills, fatigue, fever and unexpected weight change.  HENT: Positive for tinnitus. Negative for hearing loss.   Eyes: Negative for visual disturbance.  Respiratory: Negative for cough and shortness of breath.   Cardiovascular: Negative for chest pain, palpitations and leg swelling.  Genitourinary: Negative for dysuria.  Neurological: Positive for dizziness. Negative for seizures, syncope, weakness, light-headedness and headaches.  Psychiatric/Behavioral: Negative for confusion.       Objective:   Physical Exam  Constitutional: She is oriented to person, place, and time. She  appears well-developed and well-nourished.  HENT:  Right Ear: External ear normal.  Left Ear: External ear normal.  Eyes: EOM are normal. Pupils are equal, round, and reactive to light.  Neck: Neck supple. No JVD present. No thyromegaly present.  Cardiovascular: Normal rate and regular rhythm.   No murmur heard. Pulmonary/Chest: Effort normal and breath sounds normal. No respiratory distress. She has no wheezes. She has no rales.  Musculoskeletal: She exhibits no edema.  Neurological: She is alert and oriented to person, place, and time. No cranial nerve deficit. Coordination normal.       Assessment:     Vertigo.  Suspect benign peripheral vertigo.  Symptoms are improved c/w this morning.  Non-focal neuro exam at this time.     Plan:     -follow up promptly for any hearing changes, progressive/persistent dizziness, or any headaches or new symptoms. -IF she develops any persistent headache or hearing loss would go ahead with MRI to further evaluate.  Eulas Post MD [facility] at [facility]

## 2015-11-10 NOTE — Patient Instructions (Signed)
Benign Positional Vertigo Vertigo is the feeling that you or your surroundings are moving when they are not. Benign positional vertigo is the most common form of vertigo. The cause of this condition is not serious (is benign). This condition is triggered by certain movements and positions (is positional). This condition can be dangerous if it occurs while you are doing something that could endanger you or others, such as driving.  CAUSES In many cases, the cause of this condition is not known. It may be caused by a disturbance in an area of the inner ear that helps your brain to sense movement and balance. This disturbance can be caused by a viral infection (labyrinthitis), head injury, or repetitive motion. RISK FACTORS This condition is more likely to develop in:  Women.  People who are 50 years of age or older. SYMPTOMS Symptoms of this condition usually happen when you move your head or your eyes in different directions. Symptoms may start suddenly, and they usually last for less than a minute. Symptoms may include:  Loss of balance and falling.  Feeling like you are spinning or moving.  Feeling like your surroundings are spinning or moving.  Nausea and vomiting.  Blurred vision.  Dizziness.  Involuntary eye movement (nystagmus). Symptoms can be mild and cause only slight annoyance, or they can be severe and interfere with daily life. Episodes of benign positional vertigo may return (recur) over time, and they may be triggered by certain movements. Symptoms may improve over time. DIAGNOSIS This condition is usually diagnosed by medical history and a physical exam of the head, neck, and ears. You may be referred to a health care provider who specializes in ear, nose, and throat (ENT) problems (otolaryngologist) or a provider who specializes in disorders of the nervous system (neurologist). You may have additional testing, including:  MRI.  A CT scan.  Eye movement tests. Your  health care provider may ask you to change positions quickly while he or she watches you for symptoms of benign positional vertigo, such as nystagmus. Eye movement may be tested with an electronystagmogram (ENG), caloric stimulation, the Dix-Hallpike test, or the roll test.  An electroencephalogram (EEG). This records electrical activity in your brain.  Hearing tests. TREATMENT Usually, your health care provider will treat this by moving your head in specific positions to adjust your inner ear back to normal. Surgery may be needed in severe cases, but this is rare. In some cases, benign positional vertigo may resolve on its own in 2-4 weeks. HOME CARE INSTRUCTIONS Safety  Move slowly.Avoid sudden body or head movements.  Avoid driving.  Avoid operating heavy machinery.  Avoid doing any tasks that would be dangerous to you or others if a vertigo episode would occur.  If you have trouble walking or keeping your balance, try using a cane for stability. If you feel dizzy or unstable, sit down right away.  Return to your normal activities as told by your health care provider. Ask your health care provider what activities are safe for you. General Instructions  Take over-the-counter and prescription medicines only as told by your health care provider.  Avoid certain positions or movements as told by your health care provider.  Drink enough fluid to keep your urine clear or pale yellow.  Keep all follow-up visits as told by your health care provider. This is important. SEEK MEDICAL CARE IF:  You have a fever.  Your condition gets worse or you develop new symptoms.  Your family or friends   notice any behavioral changes.  Your nausea or vomiting gets worse.  You have numbness or a "pins and needles" sensation. SEEK IMMEDIATE MEDICAL CARE IF:  You have difficulty speaking or moving.  You are always dizzy.  You faint.  You develop severe headaches.  You have weakness in your  legs or arms.  You have changes in your hearing or vision.  You develop a stiff neck.  You develop sensitivity to light.   This information is not intended to replace advice given to you by your health care provider. Make sure you discuss any questions you have with your health care provider.   Document Released: 12/19/2005 Document Revised: 12/02/2014 Document Reviewed: 07/06/2014 Elsevier Interactive Patient Education 2016 [employer].  Follow up for any hearing loss, persistent tinnitus (ringing), or any new neuro symptoms Follow up if no better in one week.

## 2015-11-10 NOTE — Progress Notes (Signed)
Pre visit review using our clinic review tool, if applicable. No additional management support is needed unless otherwise documented below in the visit note. 

## 2015-11-12 ENCOUNTER — Telehealth: Payer: Self-pay | Admitting: Family

## 2015-11-12 NOTE — Telephone Encounter (Signed)
Patient Name: Jennifer Cardenas DOB: January 07, 1976 Initial Comment Caller says she had a vertigo last Monday; was seen Wednesday when doctor said to call if she had more episodes; especially with other neuro Sx. She had another episode this AM and she had cold sweats this time. How many times will it signal something wrong to have an MRI done? Nurse Assessment Nurse: Ronnald Ramp, RN, Miranda Date/Time (Eastern Time): 11/12/2015 10:43:14 AM Confirm and document reason for call. If symptomatic, describe symptoms. You must click the next button to save text entered. ---Caller states she has been having vertigo off and on since Monday. She was seen by MD on Wednesday and told to monitor for any new symptoms. She had another episode this morning that lasted 1-2 hrs. She is not having dizziness now. Has the patient traveled out of the country within the last 30 days? ---Not Applicable Does the patient have any new or worsening symptoms? ---No Please document clinical information provided and list any resource used. ---No new symptoms. Told caller to continue to monitor and call back if still having symptoms on Monday, new symptoms, or symptoms become worse. Guidelines Guideline Title Affirmed Question Affirmed Notes Final Disposition User Clinical Call Ronnald Ramp, RN, [employer]

## 2015-11-22 ENCOUNTER — Ambulatory Visit (INDEPENDENT_AMBULATORY_CARE_PROVIDER_SITE_OTHER): Payer: 59 | Admitting: Nurse Practitioner

## 2015-11-22 ENCOUNTER — Encounter: Payer: Self-pay | Admitting: Nurse Practitioner

## 2015-11-22 VITALS — BP 116/74 | HR 80 | Ht 68.25 in | Wt 210.0 lb

## 2015-11-22 DIAGNOSIS — Z01419 Encounter for gynecological examination (general) (routine) without abnormal findings: Secondary | ICD-10-CM

## 2015-11-22 DIAGNOSIS — Z0000 Encounter for general adult medical examination without abnormal findings: Secondary | ICD-10-CM

## 2015-11-22 NOTE — Patient Instructions (Signed)

## 2015-11-22 NOTE — Progress Notes (Signed)
Patient ID: Jennifer Cardenas, female   DOB: 05-21-1975, 40 y.o.   MRN: [id number]  40 y.o. [id number] Single  African American Fe here for annual exam.    Nor off Nuva Ring for 1 months.  She was having a lot more HA's with the ring.   Wants to consider other options.    Menses now 3 days - first day heavy with regular pad and changing eery 3-4 hour.  Some cramps.  Same partner X 1 year. He has had a vasectomy.  Also now on BP med's so having problems with ED.  No concerns about STD's.   Her daughter now in grad school in [place] for a Counsellor in teens.  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  Patient's last menstrual period was 11/19/2015 (exact date).          Sexually active: Yes.    The current method of family planning is vasectomy     Exercising: No.  The patient does not participate in regular exercise at present. Smoker:  no  Health Maintenance: Pap: 10/02/14, Negative with neg HR HPV History of abnormal Pap:  Yes, 2000.  Colposcopy and no biopsy.  MMG: 01/15/15, Bi-Rads 1: Negative TDaP:  03/27/2008 HIV: 10/02/14 Labs: PCP   reports that she has never smoked. She has never used smokeless tobacco. She reports that she does not drink alcohol or use drugs.  Past Medical History:  Diagnosis Date  . Acne   . Herpes simplex    H/O HSV 1   and on leg  . Hx of migraines   . Hyperemesis   . STD (sexually transmitted disease)    HSV 1    Past Surgical History:  Procedure Laterality Date  . BUNIONECTOMY Bilateral    1996 and 1998  . excision of right breast mass Right    benign    Current Outpatient Prescriptions  Medication Sig Dispense Refill  . Diclofenac Sodium (PENNSAID) 2 % SOLN Place 2 application onto the skin 2 (two) times daily. 112 g 3  . FeFum-FePoly-FA-B Cmp-C-Biot (INTEGRA PLUS) CAPS Take 1 capsule by mouth daily. 30 capsule 5  . LINZESS 145 MCG CAPS capsule TAKE 1 CAPSULE BY MOUTH ONCE DAILY 30 capsule 2  . metroNIDAZOLE (METROGEL) 0.75 % vaginal gel  Place 1 Applicatorful vaginally at bedtime. 70 g 0  . valACYclovir (VALTREX) 1000 MG tablet Take 1 tablet (1,000 mg total) by mouth 2 (two) times daily. (Patient taking differently: Take 1,000 mg by mouth as needed. ) 20 tablet 3   No current facility-administered medications for this visit.     Family History  Problem Relation Age of Onset  . Bipolar disorder Mother   . Hepatitis C Mother   . Breast cancer Maternal Grandmother   . Leukemia Maternal Grandmother   . Diabetes Other     ROS:  Pertinent items are noted in HPI.  Otherwise, a comprehensive ROS was negative.  Exam:   BP 116/74 (BP Location: Right Arm, Patient Position: Sitting, Cuff Size: Large)   Pulse 80   Ht 5' 8.25" (1.734 m)   Wt 210 lb (95.3 kg)   LMP 11/19/2015 (Exact Date)   BMI 31.70 kg/m  Height: 5' 8.25" (173.4 cm) Ht Readings from Last 3 Encounters:  11/22/15 5' 8.25" (1.734 m)  11/10/15 5\' 7"  (1.702 m)  11/02/15 5\' 7"  (1.702 m)    General appearance: alert, cooperative and appears stated age Head: Normocephalic, without obvious abnormality, atraumatic Neck:  no adenopathy, supple, symmetrical, trachea midline and thyroid normal to inspection and palpation Lungs: clear to auscultation bilaterally Breasts: normal appearance, no masses or tenderness Heart: regular rate and rhythm Abdomen: soft, non-tender; no masses,  no organomegaly Extremities: extremities normal, atraumatic, no cyanosis or edema Skin: Skin color, texture, turgor normal. No rashes or lesions Lymph nodes: Cervical, supraclavicular, and axillary nodes normal. No abnormal inguinal nodes palpated Neurologic: Grossly normal   Pelvic: External genitalia:  no lesions              Urethra:  normal appearing urethra with no masses, tenderness or lesions              Bartholin's and Skene's: normal                 Vagina: normal appearing vagina with normal color and discharge, no lesions              Cervix: anteverted              Pap  taken: No. Bimanual Exam:  Uterus:  normal size, contour, position, consistency, mobility, non-tender              Adnexa: no mass, fullness, tenderness               Rectovaginal: Confirms               Anus:  normal sphincter tone, no lesions  Chaperone present: no  A:  Well Woman with normal exam  Menstrual migraines and migraines in general.  No aura.   Hx of abnormal pap in 2000 colpo no biopsy   Wants to consider Mirena IUD   P:   Reviewed health and wellness pertinent to exam  Pap smear as above  Mammogram is due 12/2015  Information given about Mirena IUD - she may consider and want to get at next menses.  She is given insurance information as well.  Counseled on breast self exam, mammography screening, adequate intake of calcium and vitamin D, diet and exercise return annually or prn  An After Visit Summary was printed and given to the patient.

## 2015-11-23 DIAGNOSIS — H52203 Unspecified astigmatism, bilateral: Secondary | ICD-10-CM | POA: Diagnosis not present

## 2015-11-24 NOTE — Progress Notes (Signed)
Reviewed personally.  M. Suzanne Leeanna Slaby, MD.  

## 2015-11-30 ENCOUNTER — Encounter: Payer: Self-pay | Admitting: Adult Health

## 2015-11-30 ENCOUNTER — Ambulatory Visit (INDEPENDENT_AMBULATORY_CARE_PROVIDER_SITE_OTHER): Payer: 59 | Admitting: Adult Health

## 2015-11-30 ENCOUNTER — Other Ambulatory Visit: Payer: Self-pay

## 2015-11-30 VITALS — BP 124/70 | Temp 98.6°F | Wt 210.3 lb

## 2015-11-30 DIAGNOSIS — Z7689 Persons encountering health services in other specified circumstances: Secondary | ICD-10-CM

## 2015-11-30 DIAGNOSIS — Z760 Encounter for issue of repeat prescription: Secondary | ICD-10-CM

## 2015-11-30 DIAGNOSIS — Z7189 Other specified counseling: Secondary | ICD-10-CM | POA: Diagnosis not present

## 2015-11-30 DIAGNOSIS — G43801 Other migraine, not intractable, with status migrainosus: Secondary | ICD-10-CM | POA: Diagnosis not present

## 2015-11-30 DIAGNOSIS — K5909 Other constipation: Secondary | ICD-10-CM

## 2015-11-30 DIAGNOSIS — K5900 Constipation, unspecified: Secondary | ICD-10-CM

## 2015-11-30 MED ORDER — INTEGRA PLUS PO CAPS: 1.0000 | ORAL_CAPSULE | Freq: Every day | ORAL | 5 refills | Status: DC

## 2015-11-30 MED ORDER — ALUMINUM CHLORIDE 20 % EX SOLN
Freq: Every day | CUTANEOUS | 3 refills | Status: DC
Start: 2015-11-30 — End: 2016-09-26

## 2015-11-30 MED ORDER — VALACYCLOVIR HCL 1 G PO TABS: ORAL_TABLET | ORAL | 6 refills | Status: DC

## 2015-11-30 MED ORDER — VALACYCLOVIR HCL 1 G PO TABS: 1000.0000 mg | ORAL_TABLET | ORAL | 6 refills | Status: DC | PRN

## 2015-11-30 NOTE — Progress Notes (Signed)
Patient presents to clinic today to establish care. She is a pleasant 40 year old female who  has a past medical history of Acne; Herpes simplex; migraines; Hyperemesis; and STD (sexually transmitted disease).  She is due for her physical. Last was in 2016    Acute Concerns: Establish Care  Chronic Issues: Migraines - This is a chronic issue. She continues to have request migraines, especially when she is close to her menstrual cycle or when she is nauseated. She has taken abortive migraine therapy in the past past and has done prophylactic therapy with Amitriptyline, she denies any improvement with these measures.    Chronic Constipation  She has a history of chronic constipation, irritable bowel syndrome and chronic intermittent nausea. She has tried multiple medications including Linzes, which she reports " sometimes they work, and sometimes they do not." Denies: fevers, recurrent emesis, diarrhea, melena, hematochezia or abd pain. She has tried increasing her daily fiber, eating green leafy vegetables and started taking mirilex. Despite these measures she continues to have chronic constipation    Health Maintenance: Dental --Routine Care Vision -- Routine Care  Immunizations --UTD  Mammogram -- 2016  PAP -- 2016 - By GYN   Diet: Tries to eat healthy  Exercise: Sedentary lifestyle for the most important    Past Medical History:  Diagnosis Date  . Acne   . Herpes simplex    H/O HSV 1   and on leg  . Hx of migraines   . Hyperemesis   . STD (sexually transmitted disease)    HSV 1    Past Surgical History:  Procedure Laterality Date  . BUNIONECTOMY Bilateral    1996 and 1998  . excision of right breast mass Right    benign    Current Outpatient Prescriptions on File Prior to Visit  Medication Sig Dispense Refill  . Diclofenac Sodium (PENNSAID) 2 % SOLN Place 2 application onto the skin 2 (two) times daily. 112 g 3  . FeFum-FePoly-FA-B Cmp-C-Biot (INTEGRA PLUS)  CAPS Take 1 capsule by mouth daily. 30 capsule 5  . LINZESS 145 MCG CAPS capsule TAKE 1 CAPSULE BY MOUTH ONCE DAILY 30 capsule 2  . valACYclovir (VALTREX) 1000 MG tablet Take 1 tablet (1,000 mg total) by mouth 2 (two) times daily. (Patient taking differently: Take 1,000 mg by mouth as needed. ) 20 tablet 3   No current facility-administered medications on file prior to visit.     Allergies  Allergen Reactions  . Acne [Benzoyl Peroxide]     Family History  Problem Relation Age of Onset  . Bipolar disorder Mother   . Hepatitis C Mother   . Breast cancer Maternal Grandmother   . Leukemia Maternal Grandmother   . Diabetes Other     Social History   Social History  . Marital status: Single    Spouse name: N/A  . Number of children: N/A  . Years of education: N/A   Occupational History  . Not on file.   Social History Main Topics  . Smoking status: Never Smoker  . Smokeless tobacco: Never Used  . Alcohol use No     Comment: Occasionally   . Drug use: No  . Sexual activity: No     Comment: LoSeasonique   Other Topics Concern  . Not on file   Social History Narrative  . No narrative on file    Review of Systems  Constitutional: Negative.   Respiratory: Negative.   Cardiovascular: Negative.  Gastrointestinal: Positive for constipation. Negative for blood in stool, diarrhea, melena and vomiting.  Genitourinary: Negative.   Musculoskeletal: Negative.   Neurological: Positive for dizziness. Negative for loss of consciousness.       Migraine headaches  Psychiatric/Behavioral: Negative.   All other systems reviewed and are negative.   BP 124/70   Temp 98.6 F (37 C) (Oral)   Wt 210 lb 4.8 oz (95.4 kg)   LMP 11/19/2015 (Exact Date)   BMI 31.74 kg/m   Physical Exam  Constitutional: She is oriented to person, place, and time and well-developed, well-nourished, and in no distress. No distress.  HENT:  Head: Normocephalic and atraumatic.  Right Ear: External ear  normal.  Left Ear: External ear normal.  Nose: Nose normal.  Mouth/Throat: Oropharynx is clear and moist. No oropharyngeal exudate.  Eyes: Conjunctivae are normal. Pupils are equal, round, and reactive to light. Right eye exhibits no discharge. Left eye exhibits no discharge. No scleral icterus.  Neck: Normal range of motion. No thyromegaly present.  Cardiovascular: Normal rate, regular rhythm, normal heart sounds and intact distal pulses.  Exam reveals no gallop and no friction rub.   No murmur heard. Pulmonary/Chest: Effort normal and breath sounds normal. No respiratory distress. She has no wheezes. She has no rales.  Abdominal: Soft. Bowel sounds are normal. She exhibits no distension and no mass. There is no tenderness. There is no rebound and no guarding.  Obese   Musculoskeletal: Normal range of motion. She exhibits no edema or deformity.  Neurological: She is alert and oriented to person, place, and time. She has normal reflexes. No cranial nerve deficit. She exhibits normal muscle tone. Gait normal. Coordination normal. GCS score is 15.  Skin: Skin is warm and dry. No rash noted. She is not diaphoretic. No erythema. No pallor.  Psychiatric: Mood, memory, affect and judgment normal.  Nursing note and vitals reviewed.   Recent Results (from the past 2160 hour(s))  WET PREP BY MOLECULAR PROBE     Status: Abnormal   Collection Time: 11/02/15  1:33 PM  Result Value Ref Range   Candida species NEG Negative   Trichomonas vaginosis NEG Negative   Gardnerella vaginalis POS (A) Negative    Assessment/Plan: 1. Encounter to establish care - Follow up for CPE - Follow up sooner if needed - Educated on the importance of a heart healthy diet and exericse  2. Other migraine with status migrainosus, not intractable - AMB referral to headache clinic  3. Chronic constipation - Ambulatory referral to Gastroenterology - Increase exercise and fluid intake - Continue with high fiber foods  and pro biotics  4. Medication refill - valACYclovir (VALTREX) 1000 MG tablet; Take 1 tablet (1,000 mg total) by mouth as needed.  Dispense: 30 tablet; Refill: 6 - FeFum-FePoly-FA-B Cmp-C-Biot (INTEGRA PLUS) CAPS; Take 1 capsule by mouth daily.  Dispense: 30 capsule; Refill: 5 - aluminum chloride (DRYSOL) 20 % external solution; Apply topically at bedtime.  Dispense: 35 mL; Refill: 3  Dorothyann Peng, NP

## 2015-11-30 NOTE — Patient Instructions (Signed)
It was great seeing you today  I have sent in prescriptions for your medications  Please follow up with me for your physical   Someone will call you from GI and for the headache clinic  If you need anything, please let me know

## 2015-12-20 ENCOUNTER — Telehealth: Payer: Self-pay | Admitting: *Deleted

## 2015-12-20 ENCOUNTER — Other Ambulatory Visit: Payer: Self-pay | Admitting: Nurse Practitioner

## 2015-12-20 ENCOUNTER — Encounter: Payer: Self-pay | Admitting: Nurse Practitioner

## 2015-12-20 MED ORDER — METRONIDAZOLE 500 MG PO TABS: 500.0000 mg | ORAL_TABLET | Freq: Two times a day (BID) | ORAL | 0 refills | Status: DC

## 2015-12-20 MED ORDER — FLUCONAZOLE 150 MG PO TABS: 150.0000 mg | ORAL_TABLET | Freq: Once | ORAL | 0 refills | Status: AC

## 2015-12-20 MED FILL — metroNIDAZOLE 500 MG TABS: 500 | 7 days supply | Qty: 14 | Fill #0

## 2015-12-20 MED FILL — FLUCONAZOLE 150 MG TABLET: 150 | 3 days supply | Qty: 2 | Fill #0

## 2015-12-20 NOTE — Telephone Encounter (Signed)
Spoke with patient. Reviewed recommendations as seen below by Kem Boroughs. Patient states Flagyl Po causes vaginal yeast infections; patient requesting Diflucan to take with Flagyl. Advised will review with Kem Boroughs and return call. Patient is agreeable.

## 2015-12-20 NOTE — Telephone Encounter (Signed)
Lets treat her with Flagyl po for 7 days.  Give her GI and ETOH precautions and I will send in RX.  Then if no better OV.

## 2015-12-20 NOTE — Telephone Encounter (Signed)
Spoke with patient. Patient states she was in office 11/02/15 for AEX. Wet prep showed BV, treated with Metrogel. Patient states she completed Metrogel x5days. Patient reports symptoms have not gone away. Patient reports fishy vaginal odor, clear vaginal discharge and itching. Patient states she has had intercourse twice since being treated. Advised patient is best to not have intercourse while being treated. Advised patient would review with Kem Boroughs, NP and return call with recommendations. Pharmacy on file verified. Patient is agreeable.   Kem Boroughs, does she need to come back in for OV or send in a different RX, please advise?

## 2015-12-20 NOTE — Telephone Encounter (Signed)
Left message to call Sharee Pimple at [phone].      From [employer] To Kem Boroughs, FNP Sent 12/20/2015 3:49 PM  Hello, I believe I still have BV not sure if it ever went away or if I got it again. I did complete my round of antibiotics.

## 2015-12-20 NOTE — Telephone Encounter (Signed)
Rx for Diflucan is sent to pharmacy.

## 2015-12-20 NOTE — Telephone Encounter (Signed)
Spoke with patient. Advised as seen below. Patient is agreeable.   Routing to provider for final review. Patient is agreeable to disposition. Will close encounter.

## 2015-12-21 MED FILL — FOLIVANE-PLUS CAPSULE: 30 days supply | Qty: 30 | Fill #0

## 2015-12-21 MED FILL — DRYSOL DAB-O-MATIC SOLUTION: 20 | 30 days supply | Qty: 35 | Fill #0

## 2015-12-21 MED FILL — valACYclovir HCL 1 GM TABS: 1 | 30 days supply | Qty: 30 | Fill #0

## 2015-12-23 ENCOUNTER — Encounter: Payer: Self-pay | Admitting: Adult Health

## 2015-12-23 ENCOUNTER — Ambulatory Visit (INDEPENDENT_AMBULATORY_CARE_PROVIDER_SITE_OTHER): Payer: 59 | Admitting: Adult Health

## 2015-12-23 VITALS — BP 118/70 | Temp 98.6°F | Ht 67.0 in | Wt 209.7 lb

## 2015-12-23 DIAGNOSIS — Z23 Encounter for immunization: Secondary | ICD-10-CM | POA: Diagnosis not present

## 2015-12-23 DIAGNOSIS — Z0000 Encounter for general adult medical examination without abnormal findings: Secondary | ICD-10-CM

## 2015-12-23 LAB — LIPID PANEL
CHOL/HDL RATIO: 4
Cholesterol: 167 mg/dL (ref 0–200)
HDL: 46 mg/dL (ref >39.00)
LDL Cholesterol: 109 mg/dL — ABNORMAL HIGH (ref 0–99)
NONHDL: 121.42
Triglycerides: 64 mg/dL (ref 0.0–149.0)
VLDL: 12.8 mg/dL (ref 0.0–40.0)

## 2015-12-23 LAB — CBC WITH DIFFERENTIAL/PLATELET
Basophils Absolute: 0 10*3/uL (ref 0.0–0.1)
Basophils Relative: 0.5 % (ref 0.0–3.0)
EOS PCT: 0.6 % (ref 0.0–5.0)
Eosinophils Absolute: 0 10*3/uL (ref 0.0–0.7)
HEMATOCRIT: 38.2 % (ref 36.0–46.0)
HEMOGLOBIN: 12.8 g/dL (ref 12.0–15.0)
LYMPHS ABS: 1.7 10*3/uL (ref 0.7–4.0)
LYMPHS PCT: 23.2 % (ref 12.0–46.0)
MCHC: 33.4 g/dL (ref 30.0–36.0)
MCV: 87.2 fl (ref 78.0–100.0)
MONOS PCT: 10.4 % (ref 3.0–12.0)
Monocytes Absolute: 0.8 10*3/uL (ref 0.1–1.0)
NEUTROS PCT: 65.3 % (ref 43.0–77.0)
Neutro Abs: 4.8 10*3/uL (ref 1.4–7.7)
Platelets: 225 10*3/uL (ref 150.0–400.0)
RBC: 4.38 Mil/uL (ref 3.87–5.11)
RDW: 13.9 % (ref 11.5–15.5)
WBC: 7.3 10*3/uL (ref 4.0–10.5)

## 2015-12-23 LAB — BASIC METABOLIC PANEL
BUN: 9 mg/dL (ref 6–23)
CO2: 28 meq/L (ref 19–32)
Calcium: 9.2 mg/dL (ref 8.4–10.5)
Chloride: 106 mEq/L (ref 96–112)
Creatinine, Ser: 0.81 mg/dL (ref 0.40–1.20)
GFR: 99.63 mL/min (ref >60.00)
GLUCOSE: 85 mg/dL (ref 70–99)
POTASSIUM: 3.9 meq/L (ref 3.5–5.1)
SODIUM: 141 meq/L (ref 135–145)

## 2015-12-23 LAB — HEPATIC FUNCTION PANEL
ALBUMIN: 4 g/dL (ref 3.5–5.2)
ALK PHOS: 45 U/L (ref 39–117)
ALT: 12 U/L (ref 0–35)
AST: 17 U/L (ref 0–37)
Bilirubin, Direct: 0.1 mg/dL (ref 0.0–0.3)
TOTAL PROTEIN: 7.1 g/dL (ref 6.0–8.3)
Total Bilirubin: 0.6 mg/dL (ref 0.2–1.2)

## 2015-12-23 LAB — TSH: TSH: 0.71 u[IU]/mL (ref 0.35–4.50)

## 2015-12-23 LAB — POC URINALSYSI DIPSTICK (AUTOMATED)
Bilirubin, UA: NEGATIVE
GLUCOSE UA: NEGATIVE
Ketones, UA: NEGATIVE
NITRITE UA: NEGATIVE
Protein, UA: NEGATIVE
Spec Grav, UA: 1.01
UROBILINOGEN UA: 0.2
pH, UA: 5.5

## 2015-12-23 LAB — HEMOGLOBIN A1C: Hgb A1c MFr Bld: 5.7 % (ref 4.6–6.5)

## 2015-12-23 NOTE — Patient Instructions (Addendum)
It was great seeing you again!  I will follow up with you regarding your lab work.   Please continue to work on diet and exercise.   If you need anything, please let me know  Health Maintenance, Female Adopting a healthy lifestyle and getting preventive care can go a long way to promote health and wellness. Talk with your health care provider about what schedule of regular examinations is right for you. This is a good chance for you to check in with your provider about disease prevention and staying healthy. In between checkups, there are plenty of things you can do on your own. Experts have done a lot of research about which lifestyle changes and preventive measures are most likely to keep you healthy. Ask your health care provider for more information. WEIGHT AND DIET  Eat a healthy diet  Be sure to include plenty of vegetables, fruits, low-fat dairy products, and lean protein.  Do not eat a lot of foods high in solid fats, added sugars, or salt.  Get regular exercise. This is one of the most important things you can do for your health.  Most adults should exercise for at least 150 minutes each week. The exercise should increase your heart rate and make you sweat (moderate-intensity exercise).  Most adults should also do strengthening exercises at least twice a week. This is in addition to the moderate-intensity exercise.  Maintain a healthy weight  Body mass index (BMI) is a measurement that can be used to identify possible weight problems. It estimates body fat based on height and weight. Your health care provider can help determine your BMI and help you achieve or maintain a healthy weight.  For females 85 years of age and older:   A BMI below 18.5 is considered underweight.  A BMI of 18.5 to 24.9 is normal.  A BMI of 25 to 29.9 is considered overweight.  A BMI of 30 and above is considered obese.  Watch levels of cholesterol and blood lipids  You should start having  your blood tested for lipids and cholesterol at 41 years of age, then have this test every 5 years.  You may need to have your cholesterol levels checked more often if:  Your lipid or cholesterol levels are high.  You are older than 42 years of age.  You are at high risk for heart disease.  CANCER SCREENING   Lung Cancer  Lung cancer screening is recommended for adults 40-19 years old who are at high risk for lung cancer because of a history of smoking.  A yearly low-dose CT scan of the lungs is recommended for people who:  Currently smoke.  Have quit within the past 15 years.  Have at least a 30-pack-year history of smoking. A pack year is smoking an average of one pack of cigarettes a day for 1 year.  Yearly screening should continue until it has been 15 years since you quit.  Yearly screening should stop if you develop a health problem that would prevent you from having lung cancer treatment.  Breast Cancer  Practice breast self-awareness. This means understanding how your breasts normally appear and feel.  It also means doing regular breast self-exams. Let your health care provider know about any changes, no matter how small.  If you are in your 20s or 30s, you should have a clinical breast exam (CBE) by a health care provider every 1-3 years as part of a regular health exam.  If you are 40  or older, have a CBE every year. Also consider having a breast X-ray (mammogram) every year.  If you have a family history of breast cancer, talk to your health care provider about genetic screening.  If you are at high risk for breast cancer, talk to your health care provider about having an MRI and a mammogram every year.  Breast cancer gene (BRCA) assessment is recommended for women who have family members with BRCA-related cancers. BRCA-related cancers include:  Breast.  Ovarian.  Tubal.  Peritoneal cancers.  Results of the assessment will determine the need for genetic  counseling and BRCA1 and BRCA2 testing. Cervical Cancer Your health care provider may recommend that you be screened regularly for cancer of the pelvic organs (ovaries, uterus, and vagina). This screening involves a pelvic examination, including checking for microscopic changes to the surface of your cervix (Pap test). You may be encouraged to have this screening done every 3 years, beginning at age 12.  For women ages 84-65, health care providers may recommend pelvic exams and Pap testing every 3 years, or they may recommend the Pap and pelvic exam, combined with testing for human papilloma virus (HPV), every 5 years. Some types of HPV increase your risk of cervical cancer. Testing for HPV may also be done on women of any age with unclear Pap test results.  Other health care providers may not recommend any screening for nonpregnant women who are considered low risk for pelvic cancer and who do not have symptoms. Ask your health care provider if a screening pelvic exam is right for you.  If you have had past treatment for cervical cancer or a condition that could lead to cancer, you need Pap tests and screening for cancer for at least 20 years after your treatment. If Pap tests have been discontinued, your risk factors (such as having a new sexual partner) need to be reassessed to determine if screening should resume. Some women have medical problems that increase the chance of getting cervical cancer. In these cases, your health care provider may recommend more frequent screening and Pap tests. Colorectal Cancer  This type of cancer can be detected and often prevented.  Routine colorectal cancer screening usually begins at 42 years of age and continues through 42 years of age.  Your health care provider may recommend screening at an earlier age if you have risk factors for colon cancer.  Your health care provider may also recommend using home test kits to check for hidden blood in the stool.  A  small camera at the end of a tube can be used to examine your colon directly (sigmoidoscopy or colonoscopy). This is done to check for the earliest forms of colorectal cancer.  Routine screening usually begins at age 49.  Direct examination of the colon should be repeated every 5-10 years through 42 years of age. However, you may need to be screened more often if early forms of precancerous polyps or small growths are found. Skin Cancer  Check your skin from head to toe regularly.  Tell your health care provider about any new moles or changes in moles, especially if there is a change in a mole's shape or color.  Also tell your health care provider if you have a mole that is larger than the size of a pencil eraser.  Always use sunscreen. Apply sunscreen liberally and repeatedly throughout the day.  Protect yourself by wearing long sleeves, pants, a wide-brimmed hat, and sunglasses whenever you are outside. HEART DISEASE,  DIABETES, AND HIGH BLOOD PRESSURE   High blood pressure causes heart disease and increases the risk of stroke. High blood pressure is more likely to develop in:  People who have blood pressure in the high end of the normal range (130-139/85-89 mm Hg).  People who are overweight or obese.  People who are African American.  If you are 42-41 years of age, have your blood pressure checked every 3-5 years. If you are 38 years of age or older, have your blood pressure checked every year. You should have your blood pressure measured twice--once when you are at a hospital or clinic, and once when you are not at a hospital or clinic. Record the average of the two measurements. To check your blood pressure when you are not at a hospital or clinic, you can use:  An automated blood pressure machine at a pharmacy.  A home blood pressure monitor.  If you are between 10 years and 87 years old, ask your health care provider if you should take aspirin to prevent strokes.  Have  regular diabetes screenings. This involves taking a blood sample to check your fasting blood sugar level.  If you are at a normal weight and have a low risk for diabetes, have this test once every three years after 42 years of age.  If you are overweight and have a high risk for diabetes, consider being tested at a younger age or more often. PREVENTING INFECTION  Hepatitis B  If you have a higher risk for hepatitis B, you should be screened for this virus. You are considered at high risk for hepatitis B if:  You were born in a country where hepatitis B is common. Ask your health care provider which countries are considered high risk.  Your parents were born in a high-risk country, and you have not been immunized against hepatitis B (hepatitis B vaccine).  You have HIV or AIDS.  You use needles to inject street drugs.  You live with someone who has hepatitis B.  You have had sex with someone who has hepatitis B.  You get hemodialysis treatment.  You take certain medicines for conditions, including cancer, organ transplantation, and autoimmune conditions. Hepatitis C  Blood testing is recommended for:  Everyone born from [birth year] through 1967.  Anyone with known risk factors for hepatitis C. Sexually transmitted infections (STIs)  You should be screened for sexually transmitted infections (STIs) including gonorrhea and chlamydia if:  You are sexually active and are younger than 42 years of age.  You are older than 42 years of age and your health care provider tells you that you are at risk for this type of infection.  Your sexual activity has changed since you were last screened and you are at an increased risk for chlamydia or gonorrhea. Ask your health care provider if you are at risk.  If you do not have HIV, but are at risk, it may be recommended that you take a prescription medicine daily to prevent HIV infection. This is called pre-exposure prophylaxis (PrEP). You are  considered at risk if:  You are sexually active and do not regularly use condoms or know the HIV status of your partner(s).  You take drugs by injection.  You are sexually active with a partner who has HIV. Talk with your health care provider about whether you are at high risk of being infected with HIV. If you choose to begin PrEP, you should first be tested for HIV. You should then  be tested every 3 months for as long as you are taking PrEP.  PREGNANCY   If you are premenopausal and you may become pregnant, ask your health care provider about preconception counseling.  If you may become pregnant, take 400 to 800 micrograms (mcg) of folic acid every day.  If you want to prevent pregnancy, talk to your health care provider about birth control (contraception). OSTEOPOROSIS AND MENOPAUSE   Osteoporosis is a disease in which the bones lose minerals and strength with aging. This can result in serious bone fractures. Your risk for osteoporosis can be identified using a bone density scan.  If you are 33 years of age or older, or if you are at risk for osteoporosis and fractures, ask your health care provider if you should be screened.  Ask your health care provider whether you should take a calcium or vitamin D supplement to lower your risk for osteoporosis.  Menopause may have certain physical symptoms and risks.  Hormone replacement therapy may reduce some of these symptoms and risks. Talk to your health care provider about whether hormone replacement therapy is right for you.  HOME CARE INSTRUCTIONS   Schedule regular health, dental, and eye exams.  Stay current with your immunizations.   Do not use any tobacco products including cigarettes, chewing tobacco, or electronic cigarettes.  If you are pregnant, do not drink alcohol.  If you are breastfeeding, limit how much and how often you drink alcohol.  Limit alcohol intake to no more than 1 drink per day for nonpregnant women. One  drink equals 12 ounces of beer, 5 ounces of wine, or 1 ounces of hard liquor.  Do not use street drugs.  Do not share needles.  Ask your health care provider for help if you need support or information about quitting drugs.  Tell your health care provider if you often feel depressed.  Tell your health care provider if you have ever been abused or do not feel safe at home.   This information is not intended to replace advice given to you by your health care provider. Make sure you discuss any questions you have with your health care provider.   Document Released: 09/26/2010 Document Revised: 04/03/2014 Document Reviewed: 02/12/2013 Elsevier Interactive Patient Education [employer].

## 2015-12-23 NOTE — Progress Notes (Signed)
Subjective:    Patient ID: Jennifer Cardenas, female    DOB: [birth year], 40 y.o.   MRN: [id number]  HPI  Patient presents for yearly preventative medicine examination. She is a pleasant 40 year old female who  has a past medical history of Acne; Constipation; Herpes simplex; migraines; Hyperemesis; and STD (sexually transmitted disease).  All immunizations and health maintenance protocols were reviewed with the patient and needed orders were placed. She will get her flu shot today  Appropriate screening laboratory values were ordered for the patient including screening of hyperlipidemia, renal function and hepatic function.  Medication reconciliation,  past medical history, social history, problem list and allergies were reviewed in detail with the patient  Goals were established with regard to weight loss, exercise, and  diet in compliance with medications. She is exercising but has not been working on her diet. 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   Wt Readings from Last 3 Encounters:  12/23/15 209 lb 11.2 oz (95.1 kg)  11/30/15 210 lb 4.8 oz (95.4 kg)  11/22/15 210 lb (95.3 kg)    She is up to date on GYN exam, dental and vision exams. She does self breast exams.   She is following up with GI for constipation and Neurology for migraines.    Review of Systems  Constitutional: Negative.   Eyes: Negative.   Respiratory: Negative.   Cardiovascular: Negative.   Gastrointestinal: Negative.   Endocrine: Negative.   Genitourinary: Negative.   Musculoskeletal: Positive for myalgias (shoulders).  Skin: Negative.   Neurological: Positive for headaches (chronic).  Hematological: Negative.   Psychiatric/Behavioral: Negative.   All other systems reviewed and are negative.  Past Medical History:  Diagnosis Date  . Acne   . Constipation   . Herpes simplex    H/O HSV 1   and on leg  . Hx of migraines   . Hyperemesis   . STD (sexually transmitted  disease)    HSV 1    Social History   Social History  . Marital status: Single    Spouse name: N/A  . Number of children: N/A  . Years of education: N/A   Occupational History  . Not on file.   Social History Main Topics  . Smoking status: Never Smoker  . Smokeless tobacco: Never Used  . Alcohol use No     Comment: Occasionally   . Drug use: No  . Sexual activity: No     Comment: LoSeasonique   Other Topics Concern  . 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 ( 59 y/o)           Past Surgical History:  Procedure Laterality Date  . BUNIONECTOMY Bilateral    1996 and 1998  . excision of right breast mass Right    benign    Family History  Problem Relation Age of Onset  . Bipolar disorder Mother   . Hepatitis C Mother   . Breast cancer Maternal Grandmother   . Leukemia Maternal Grandmother   . Diabetes Other     Allergies  Allergen Reactions  . Acne [Benzoyl Peroxide]     Current Outpatient Prescriptions on File Prior to Visit  Medication Sig Dispense Refill  . aluminum chloride (DRYSOL) 20 % external solution Apply topically at bedtime. 35 mL 3  . Diclofenac Sodium (PENNSAID) 2 %  SOLN Place 2 application onto the skin 2 (two) times daily. 112 g 3  . FeFum-FePoly-FA-B Cmp-C-Biot (INTEGRA PLUS) CAPS Take 1 capsule by mouth daily. 30 capsule 5  . LINZESS 145 MCG CAPS capsule TAKE 1 CAPSULE BY MOUTH ONCE DAILY 30 capsule 2  . metroNIDAZOLE (FLAGYL) 500 MG tablet Take 1 tablet (500 mg total) by mouth 2 (two) times daily. 14 tablet 0  . valACYclovir (VALTREX) 1000 MG tablet Take one tablet by mouth once daily as needed. 30 tablet 6   No current facility-administered medications on file prior to visit.     BP 118/70   Temp 98.6 F (37 C) (Oral)   Ht 5\' 7"  (1.702 m)   Wt 209 lb 11.2 oz (95.1 kg)   BMI 32.84 kg/m       Objective:   Physical Exam  Constitutional: She is oriented to person, place,  and time. She appears well-developed and well-nourished. No distress.  Slightly obese  HENT:  Head: Normocephalic and atraumatic.  Right Ear: External ear normal.  Left Ear: External ear normal.  Nose: Nose normal.  Mouth/Throat: Oropharynx is clear and moist. No oropharyngeal exudate.  Eyes: Conjunctivae and EOM are normal. Pupils are equal, round, and reactive to light. Right eye exhibits no discharge. Left eye exhibits no discharge. No scleral icterus.  Neck: Normal range of motion. Neck supple. No JVD present. No tracheal deviation present. No thyromegaly present.  Cardiovascular: Normal rate, regular rhythm, normal heart sounds and intact distal pulses.  Exam reveals no gallop and no friction rub.   No murmur heard. Pulmonary/Chest: Effort normal and breath sounds normal. No stridor. No respiratory distress. She has no wheezes. She has no rales. She exhibits no tenderness.  Abdominal: Soft. Bowel sounds are normal. She exhibits no distension and no mass. There is no tenderness. There is no rebound and no guarding.  Genitourinary:  Genitourinary Comments: Refused: GYN and breast exam done by GYN   Musculoskeletal: Normal range of motion. She exhibits no edema, tenderness or deformity.  Lymphadenopathy:    She has no cervical adenopathy.  Neurological: She is alert and oriented to person, place, and time. She displays normal reflexes. No cranial nerve deficit. She exhibits normal muscle tone. Coordination normal.  Skin: Skin is warm and dry. No rash noted. She is not diaphoretic. No erythema. No pallor.  Psychiatric: She has a normal mood and affect. Her behavior is normal. Judgment and thought content normal.  Nursing note and vitals reviewed.     Assessment & Plan:  1. Routine general medical examination at a health care facility - POCT Urinalysis Dipstick (Automated) - Basic metabolic panel - CBC with Differential/Platelet - Hemoglobin A1c - Hepatic function panel - Lipid  panel - TSH - Encouraged a heart healthy diet and frequent exercise.  - Follow up with neurology and GI as directed - CPE in one year or  - Follow up sooner for any acute issues  2. Need for prophylactic vaccination and inoculation against influenza  - Flu Vaccine QUAD 36+ mos PF IM (Fluarix & Fluzone Quad PF)  Dorothyann Peng, NP

## 2016-02-03 ENCOUNTER — Ambulatory Visit (INDEPENDENT_AMBULATORY_CARE_PROVIDER_SITE_OTHER): Payer: 59 | Admitting: Neurology

## 2016-02-03 ENCOUNTER — Encounter: Payer: Self-pay | Admitting: Neurology

## 2016-02-03 VITALS — BP 126/66 | HR 84 | Ht 68.0 in | Wt 205.0 lb

## 2016-02-03 DIAGNOSIS — G43829 Menstrual migraine, not intractable, without status migrainosus: Secondary | ICD-10-CM

## 2016-02-03 MED ORDER — SUMATRIPTAN SUCCINATE 100 MG PO TABS: ORAL_TABLET | ORAL | 2 refills | Status: DC

## 2016-02-03 MED ORDER — METOCLOPRAMIDE HCL 10 MG PO TABS: 10.0000 mg | ORAL_TABLET | Freq: Four times a day (QID) | ORAL | 1 refills | Status: DC

## 2016-02-03 MED FILL — METOCLOPRAMIDE 10 MG TABLET: 10 | 7 days supply | Qty: 30 | Fill #0

## 2016-02-03 MED FILL — SUMATRIPTAN SUCC 100 MG TAB: 100 | 30 days supply | Qty: 9 | Fill #0

## 2016-02-03 NOTE — Progress Notes (Signed)
NEUROLOGY CONSULTATION NOTE  Jennifer DEBES MRN: [id number] DOB: 1975/10/03  Referring provider: Dorothyann Peng, NP Primary care provider: Dorothyann Peng, NP  Reason for consult:  headache  HISTORY OF PRESENT ILLNESS: Jennifer Cardenas is a 40 year old left-handed woman who presents for migraines.  History obtained by patient and PCP Note.  Onset:  Childhood.  Became associated with her menstrual cycle in her 58s after birth of her son Location:  Unilateral, either side (peri-orbital, temples, down neck) Quality:  Pounding, pressure Intensity:  Moderate 5-7/10, severe 10/10 Aura:  no Prodrome:  no Associated symptoms:  Nausea, vomiting, photophobia, phonophobia Duration:  2 to 10 days Frequency:  Varies around period (prior, during or after) Triggers/exacerbating factors:  Cycle, sinus problems, stress Relieving factors:  Shoulder massage Activity:  Unable to function when severe  Past NSAIDS:  no Past analgesics:  Tylenol Past abortive triptans:  Sumatriptan 25mg  Past muscle relaxants:  no Past anti-emetic:  Phenergan (drowsiness), Zofran (ineffective), Reglan (effective) Past antihypertensive medications:  propranolol Past antidepressant medications:  Amitriptyline 20mg  Past anticonvulsant medications:  no Past vitamins/Herbal/Supplements:  no Other past therapies:  Continuous hormone therapy (ineffective)  Current NSAIDS:  Motrin Current analgesics:  Excedrin Current triptans:  no Current anti-emetic:  no Current muscle relaxants:  no Current anti-anxiolytic:  no Current sleep aide:  no Current Antihypertensive medications:  no Current Antidepressant medications:  no Current Anticonvulsant medications:  no Current Vitamins/Herbal/Supplements:  no Current Antihistamines/Decongestants:  no Other therapy:  No  She discontinued birth control 3 months ago and she has since not had a headache.  She did have a bout of BPPV around that time, which resolved after 3  days.   Caffeine:  occasional Alcohol:  no Smoker:  no Diet:  Not hydrates Exercise:  no Depression/stress:  no Sleep hygiene:  Poor.  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  PAST MEDICAL HISTORY: Past Medical History:  Diagnosis Date  . Acne   . Constipation   . Herpes simplex    H/O HSV 1   and on leg  . Hx of migraines   . Hyperemesis   . STD (sexually transmitted disease)    HSV 1    PAST SURGICAL HISTORY: Past Surgical History:  Procedure Laterality Date  . BUNIONECTOMY Bilateral    1996 and 1998  . excision of right breast mass Right    benign    MEDICATIONS: Current Outpatient Prescriptions on File Prior to Visit  Medication Sig Dispense Refill  . aluminum chloride (DRYSOL) 20 % external solution Apply topically at bedtime. 35 mL 3  . Diclofenac Sodium (PENNSAID) 2 % SOLN Place 2 application onto the skin 2 (two) times daily. 112 g 3  . FeFum-FePoly-FA-B Cmp-C-Biot (INTEGRA PLUS) CAPS Take 1 capsule by mouth daily. 30 capsule 5  . LINZESS 145 MCG CAPS capsule TAKE 1 CAPSULE BY MOUTH ONCE DAILY 30 capsule 2  . valACYclovir (VALTREX) 1000 MG tablet Take one tablet by mouth once daily as needed. 30 tablet 6   No current facility-administered medications on file prior to visit.     ALLERGIES: Allergies  Allergen Reactions  . Acne [Benzoyl Peroxide] Swelling    FAMILY HISTORY: Family History  Problem Relation Age of Onset  . Bipolar disorder Mother   . Hepatitis C Mother   . Breast cancer Maternal Grandmother   . Leukemia Maternal Grandmother   .  Diabetes Other     SOCIAL HISTORY: Social History   Social History  . Marital status: Single    Spouse name: Jennifer Cardenas  . Number of children: Jennifer Cardenas  . Years of education: Jennifer Cardenas   Occupational History  . Not on file.   Social History Main Topics  . Smoking status: Never Smoker  . Smokeless tobacco: Never Used  . Alcohol  use No     Comment: Occasionally   . Drug use: No  . Sexual activity: No     Comment: LoSeasonique   Other Topics Concern  . 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)           REVIEW OF SYSTEMS: Constitutional: No fevers, chills, or sweats, no generalized fatigue, change in appetite Eyes: No visual changes, double vision, eye pain Ear, nose and throat: No hearing loss, ear pain, nasal congestion, sore throat Cardiovascular: No chest pain, palpitations Respiratory:  No shortness of breath at rest or with exertion, wheezes GastrointestinaI: No nausea, vomiting, diarrhea, abdominal pain, fecal incontinence Genitourinary:  No dysuria, urinary retention or frequency Musculoskeletal:  No neck pain, back pain Integumentary: No rash, pruritus, skin lesions Neurological: as above Psychiatric: No depression, insomnia, anxiety Endocrine: No palpitations, fatigue, diaphoresis, mood swings, change in appetite, change in weight, increased thirst Hematologic/Lymphatic:  No purpura, petechiae. Allergic/Immunologic: no itchy/runny eyes, nasal congestion, recent allergic reactions, rashes  PHYSICAL EXAM: Vitals:   02/03/16 0800  BP: 126/66  Pulse: 84   General: No acute distress.  Patient appears well-groomed.  Head:  Normocephalic/atraumatic Eyes:  fundi examined but not visualized Neck: supple, no paraspinal tenderness, full range of motion Back: No paraspinal tenderness Heart: regular rate and rhythm Lungs: Clear to auscultation bilaterally. Vascular: No carotid bruits. Neurological Exam: Mental status: alert and oriented to person, place, and time, recent and remote memory intact, fund of knowledge intact, attention and concentration intact, speech fluent and not dysarthric, language intact. Cranial nerves: CN I: not tested CN II: pupils equal, round and reactive to light, visual fields intact CN III, IV, VI:  full  range of motion, no nystagmus, no ptosis CN V: facial sensation intact CN VII: upper and lower face symmetric CN VIII: hearing intact CN IX, X: gag intact, uvula midline CN XI: sternocleidomastoid and trapezius muscles intact CN XII: tongue midline Bulk & Tone: normal, no fasciculations. Motor:  5/5 throughout  Sensation: temperature and vibration sensation intact. Deep Tendon Reflexes:  2+ throughout, toes downgoing.  Finger to nose testing:  Without dysmetria.  Heel to shin:  Without dysmetria.  Gait:  Normal station and stride.  Able to turn and tandem walk. Romberg negative.  IMPRESSION: Menstrual migraines, currently controlled.  Likely triggered by birth control medication.  PLAN: 1.  Will provide higher dose of sumatriptan (100mg ) and Reglan if she should have another migraine 2.  No need to start preventative at this point.  Other options include restarting amitriptyline at higher dose/or nortriptyline, topiramate 3.  Follow up as needed.  Thank you for allowing me to take part in the care of this patient.  Metta Clines, DO  CC:  Dorothyann Peng, NP

## 2016-02-03 NOTE — Patient Instructions (Signed)
Migraine Recommendations: 1.  At this point, we do not need to start a preventative medication 2.  Take sumatriptan 100mg  at earliest onset of headache.  May repeat dose once in 2 hours if needed.  Do not exceed two tablets in 24 hours.  Use Reglan 10mg  for nausea. 3.  Limit use of pain relievers to no more than 2 days out of the week.  These medications include acetaminophen, ibuprofen, triptans and narcotics.  This will help reduce risk of rebound headaches. 4.  Be aware of common food triggers such as processed sweets, processed foods with nitrites (such as deli meat, hot dogs, sausages), foods with MSG, alcohol (such as wine), chocolate, certain cheeses, certain fruits (dried fruits, some citrus fruit), vinegar, diet soda. 4.  Avoid caffeine 5.  Routine exercise 6.  Proper sleep hygiene 7.  Stay adequately hydrated with water 8.  Keep a headache diary. 9.  Maintain proper stress management. 10.  Do not skip meals. 11.  Consider supplements:  Magnesium citrate 400mg  to 600mg  daily, riboflavin 400mg , Coenzyme Q 10 100mg  three times daily 12.  If you have recurrence of migraines and sumatriptan not effective or they are frequent, contact me.   Migraine Headache A migraine headache is an intense, throbbing pain on one or both sides of your head. A migraine can last for 30 minutes to several hours. CAUSES  The exact cause of a migraine headache is not always known. However, a migraine may be caused when nerves in the brain become irritated and release chemicals that cause inflammation. This causes pain. Certain things may also trigger migraines, such as:  Alcohol.  Smoking.  Stress.  Menstruation.  Aged cheeses.  Foods or drinks that contain nitrates, glutamate, aspartame, or tyramine.  Lack of sleep.  Chocolate.  Caffeine.  Hunger.  Physical exertion.  Fatigue.  Medicines used to treat chest pain (nitroglycerine), birth control pills, estrogen, and some blood pressure  medicines. SIGNS AND SYMPTOMS  Pain on one or both sides of your head.  Pulsating or throbbing pain.  Severe pain that prevents daily activities.  Pain that is aggravated by any physical activity.  Nausea, vomiting, or both.  Dizziness.  Pain with exposure to bright lights, loud noises, or activity.  General sensitivity to bright lights, loud noises, or smells. Before you get a migraine, you may get warning signs that a migraine is coming (aura). An aura may include:  Seeing flashing lights.  Seeing bright spots, halos, or zigzag lines.  Having tunnel vision or blurred vision.  Having feelings of numbness or tingling.  Having trouble talking.  Having muscle weakness. DIAGNOSIS  A migraine headache is often diagnosed based on:  Symptoms.  Physical exam.  A CT scan or MRI of your head. These imaging tests cannot diagnose migraines, but they can help rule out other causes of headaches. TREATMENT Medicines may be given for pain and nausea. Medicines can also be given to help prevent recurrent migraines.  HOME CARE INSTRUCTIONS  Only take over-the-counter or prescription medicines for pain or discomfort as directed by your health care provider. The use of long-term narcotics is not recommended.  Lie down in a dark, quiet room when you have a migraine.  Keep a journal to find out what may trigger your migraine headaches. For example, write down:  What you eat and drink.  How much sleep you get.  Any change to your diet or medicines.  Limit alcohol consumption.  Quit smoking if you smoke.  Get  7-9 hours of sleep, or as recommended by your health care provider.  Limit stress.  Keep lights dim if bright lights bother you and make your migraines worse. SEEK IMMEDIATE MEDICAL CARE IF:   Your migraine becomes severe.  You have a fever.  You have a stiff neck.  You have vision loss.  You have muscular weakness or loss of muscle control.  You start losing  your balance or have trouble walking.  You feel faint or pass out.  You have severe symptoms that are different from your first symptoms. MAKE SURE YOU:   Understand these instructions.  Will watch your condition.  Will get help right away if you are not doing well or get worse.   This information is not intended to replace advice given to you by your health care provider. Make sure you discuss any questions you have with your health care provider.   Document Released: 03/13/2005 Document Revised: 04/03/2014 Document Reviewed: 11/18/2012 Elsevier Interactive Patient Education [employer].

## 2016-02-13 ENCOUNTER — Encounter: Payer: Self-pay | Admitting: Adult Health

## 2016-02-14 ENCOUNTER — Ambulatory Visit (INDEPENDENT_AMBULATORY_CARE_PROVIDER_SITE_OTHER): Payer: 59 | Admitting: Internal Medicine

## 2016-02-14 ENCOUNTER — Encounter: Payer: Self-pay | Admitting: Internal Medicine

## 2016-02-14 VITALS — BP 114/84 | HR 96 | Ht 68.11 in | Wt 208.2 lb

## 2016-02-14 DIAGNOSIS — K5900 Constipation, unspecified: Secondary | ICD-10-CM

## 2016-02-14 DIAGNOSIS — R1084 Generalized abdominal pain: Secondary | ICD-10-CM | POA: Diagnosis not present

## 2016-02-14 MED ORDER — NA SULFATE-K SULFATE-MG SULF 17.5-3.13-1.6 GM/177ML PO SOLN: 1.0000 | Freq: Once | ORAL | 0 refills | Status: AC

## 2016-02-14 MED FILL — SUPREP BOWEL PREP KIT: 17.5-3.13-1 | 1 days supply | Qty: 354 | Fill #0

## 2016-02-14 NOTE — Telephone Encounter (Signed)
I called the pt and informed her I am reviewing the message since Georgina Snell is out of the office today and due to the symptoms she is having she should be seen here in the office.  I scheduled an appt for tomorrow at 3pm.

## 2016-02-14 NOTE — Progress Notes (Signed)
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 Patient reports lifelong problems with constipation. Problems have worsened in recent years. She reports having a bowel movement approximately once every 2 weeks. Significant abdominal bloating. Has tried multiple agents including stool softeners, milk of magnesia, suppositories, and purge dose. Variable or temporary results. Has been on linzess 145 g daily for months. Initially helped. Subsequently did not. She has stopped the drug. With constipation she does have abdominal discomfort. She also reports intermittent rectal bleeding which she attributes to hemorrhoids. No previous colonoscopy. Father with a history of colon polyps.  REVIEW OF SYSTEMS:  All non-GI ROS negative except for sinus and allergy trouble, anxiety, arthritis, cough, fatigue, headaches  Past Medical History:  Diagnosis Date  . Acne   . Anxiety   . Arthritis   . Constipation   . Herpes simplex    H/O HSV 1   and on leg  . Hx of migraines   . Hyperemesis   . STD (sexually transmitted disease)    HSV 1    Past Surgical History:  Procedure Laterality Date  . BUNIONECTOMY Bilateral    1996 and 1998  . excision of right breast mass Right    benign  . UMBILICAL HERNIA REPAIR      Social History Jennifer Cardenas  reports that she has never smoked. She has never used smokeless tobacco. She reports that she drinks alcohol. She reports that she does not use drugs.  family history includes Atrial fibrillation in her father; Bipolar disorder in her mother; Breast cancer in her maternal grandmother; Colon polyps in her father; Diabetes in her other; Hepatitis C in her mother; Hyperlipidemia in her father; Hypertension in her father; Leukemia in her maternal grandmother.  Allergies   Allergen Reactions  . Benzoyl Peroxide Swelling    For acne        PHYSICAL EXAMINATION:  Vital signs: BP 114/84 (BP Location: Left Arm, Patient Position: Sitting, Cuff Size: Large)   Pulse 96   Ht 5' 8.11" (1.73 m) Comment: height measured without shoes  Wt 208 lb 4 oz (94.5 kg)   LMP 02/09/2016   BMI 31.56 kg/m   Constitutional: Pleasant, generally well-appearing, no acute distress Psychiatric: alert and oriented x3, cooperative Eyes: extraocular movements intact, anicteric, conjunctiva pink Mouth: oral pharynx moist, no lesions Neck: supple no lymphadenopathy Cardiovascular: heart regular rate and rhythm, no murmur Lungs: clear to auscultation bilaterally Abdomen: soft, obese, nontender, nondistended, no obvious ascites, no peritoneal signs, normal bowel sounds, no organomegaly Rectal: Deferred until colonoscopy Extremities: no lower extremity edema bilaterally Skin: no lesions on visible extremities Neuro: No focal deficits. Normal DTRs. Cranial nerves intact  ASSESSMENT:  #1. Chronic constipation. Worsening #2. Abdominal discomfort and bloating. Likely secondary to constipation #3. Intermittent rectal bleeding. Possibly hemorrhoids. #4. Family history of colon polyps  PLAN:  #1. Linzess 290 g daily. Multiple samples given. Can prescribed if helpful #2. Daily MiraLAX. Discussed titration strategy to achieve desired effect #3. Colonoscopy to evaluate progressive constipation, abdominal pain, and intermittent rectal bleeding.The nature of the procedure, as well as the risks, benefits, and alternatives were carefully and thoroughly reviewed with the patient. Ample time for discussion and questions allowed. The patient understood, was satisfied, and agreed to proceed. #4. Because of constipation will recommend more extensive prep  with magnesium citrate followed by Su prep   A copy of this consultation note has been sent to [facility]

## 2016-02-14 NOTE — Patient Instructions (Signed)
You have been scheduled for a colonoscopy. Please follow written instructions given to you at your visit today.  Please pick up your prep supplies at the pharmacy within the next 1-3 days. If you use inhalers (even only as needed), please bring them with you on the day of your procedure. Your physician has requested that you go to www.startemmi.com and enter the access code given to you at your visit today. This web site gives a general overview about your procedure. However, you should still follow specific instructions given to you by our office regarding your preparation for the procedure.  Use Miralax daily  You have been given some samples of Linzess 261mcg to try daily - 30 minutes before the first meal of the day..  If they work well call the office and I will send in a prescription.

## 2016-02-15 ENCOUNTER — Ambulatory Visit (INDEPENDENT_AMBULATORY_CARE_PROVIDER_SITE_OTHER): Payer: 59 | Admitting: Adult Health

## 2016-02-15 ENCOUNTER — Encounter: Payer: Self-pay | Admitting: Adult Health

## 2016-02-15 ENCOUNTER — Ambulatory Visit (INDEPENDENT_AMBULATORY_CARE_PROVIDER_SITE_OTHER)
Admission: RE | Admit: 2016-02-15 | Discharge: 2016-02-15 | Disposition: A | Discharge status: Home / Self Care | Payer: 59 | Source: Ambulatory Visit | Attending: Adult Health | Admitting: Adult Health

## 2016-02-15 VITALS — BP 110/60 | Temp 99.7°F

## 2016-02-15 DIAGNOSIS — R05 Cough: Secondary | ICD-10-CM

## 2016-02-15 DIAGNOSIS — R059 Cough, unspecified: Secondary | ICD-10-CM

## 2016-02-15 MED ORDER — HYDROCODONE-HOMATROPINE 5-1.5 MG/5ML PO SYRP: 5.0000 mL | ORAL_SOLUTION | Freq: Three times a day (TID) | ORAL | 0 refills | Status: DC | PRN

## 2016-02-15 MED FILL — HYDROCODONE-HOMATROPINE SYR: 5-1.5 | 8 days supply | Qty: 120 | Fill #0

## 2016-02-15 NOTE — Progress Notes (Signed)
Subjective:    Patient ID: Jennifer Cardenas, female    DOB: 06-26-1975, 40 y.o.   MRN: [id number]  Cough  This is a new problem. The current episode started 1 to 4 weeks ago (6 weeks ). The problem has been unchanged. The cough is productive of sputum. Associated symptoms include chills, a fever, nasal congestion, rhinorrhea, shortness of breath, sweats and wheezing. Pertinent negatives include no ear congestion, ear pain or sore throat. The treatment provided no relief.      Review of Systems  Constitutional: Positive for activity change, chills, fatigue and fever.  HENT: Positive for congestion, rhinorrhea, sinus pain, sinus pressure and voice change. Negative for ear pain and sore throat.   Eyes: Negative.   Respiratory: Positive for cough, shortness of breath and wheezing.   Gastrointestinal: Negative.   Genitourinary: Negative.   Musculoskeletal: Negative.   Neurological: Negative.    Past Medical History:  Diagnosis Date  . Acne   . Anxiety   . Arthritis   . Constipation   . Herpes simplex    H/O HSV 1   and on leg  . Hx of migraines   . Hyperemesis   . STD (sexually transmitted disease)    HSV 1    Social History   Social History  . Marital status: Single    Spouse name: N/A  . Number of children: 2  . Years of education: N/A   Occupational History  . RN    Social History Main Topics  . Smoking status: Never Smoker  . Smokeless tobacco: Never Used  . Alcohol use 0.0 oz/week     Comment: Occasionally   . Drug use: No  . Sexual activity: No     Comment: Lo[place]   Other Topics Concern  . 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 ( 37 y/o)           Past Surgical History:  Procedure Laterality Date  . BUNIONECTOMY Bilateral    1996 and 1998  . excision of right breast mass Right    benign  . UMBILICAL HERNIA REPAIR      Family History  Problem Relation Age of Onset  . Bipolar  disorder Mother   . Hepatitis C Mother   . Colon polyps Father   . Atrial fibrillation Father   . Hypertension Father   . Hyperlipidemia Father   . Breast cancer Maternal Grandmother   . Leukemia Maternal Grandmother   . Diabetes Other     Allergies  Allergen Reactions  . Benzoyl Peroxide Swelling    For acne     Current Outpatient Prescriptions on File Prior to Visit  Medication Sig Dispense Refill  . aluminum chloride (DRYSOL) 20 % external solution Apply topically at bedtime. 35 mL 3  . Diclofenac Sodium (PENNSAID) 2 % SOLN Place 2 application onto the skin 2 (two) times daily. 112 g 3  . FeFum-FePoly-FA-B Cmp-C-Biot (INTEGRA PLUS) CAPS Take 1 capsule by mouth daily. 30 capsule 5  . LINZESS 145 MCG CAPS capsule TAKE 1 CAPSULE BY MOUTH ONCE DAILY 30 capsule 2  . metoCLOPramide (REGLAN) 10 MG tablet Take 1 tablet (10 mg total) by mouth 4 (four) times daily. 30 tablet 1  . SUMAtriptan (IMITREX) 100 MG tablet Take 1 tablet at earliest onset of migraine.  May repeat once in 2 hours if headache persists or recurs. 10  tablet 2  . valACYclovir (VALTREX) 1000 MG tablet Take one tablet by mouth once daily as needed. 30 tablet 6   No current facility-administered medications on file prior to visit.     BP 110/60   Temp 99.7 F (37.6 C) (Oral)   LMP 02/09/2016       Objective:   Physical Exam  Constitutional: She is oriented to person, place, and time. She appears well-developed and well-nourished. No distress.  HENT:  Head: Normocephalic and atraumatic.  Right Ear: External ear normal.  Left Ear: External ear normal.  Nose: Mucosal edema and rhinorrhea present. Right sinus exhibits no maxillary sinus tenderness and no frontal sinus tenderness. Left sinus exhibits no maxillary sinus tenderness and no frontal sinus tenderness.  Mouth/Throat: Uvula is midline, oropharynx is clear and moist and mucous membranes are normal. No oropharyngeal exudate, posterior oropharyngeal edema,  posterior oropharyngeal erythema or tonsillar abscesses.  Eyes: Conjunctivae and EOM are normal. Pupils are equal, round, and reactive to light.  Neck: Normal range of motion. Neck supple. No thyromegaly present.  Cardiovascular: Normal rate, regular rhythm, normal heart sounds and intact distal pulses.  Exam reveals no gallop and no friction rub.   No murmur heard. Pulmonary/Chest: Effort normal. No respiratory distress. She has wheezes (trace throughout). She has no rales. She exhibits no tenderness.  Musculoskeletal: Normal range of motion. She exhibits no edema, tenderness or deformity.  Lymphadenopathy:    She has no cervical adenopathy.  Neurological: She is alert and oriented to person, place, and time.  Skin: Skin is warm and dry. No rash noted. She is not diaphoretic. No erythema. No pallor.  Psychiatric: She has a normal mood and affect. Her behavior is normal. Judgment and thought content normal.  Nursing note and vitals reviewed.     Assessment & Plan:  1. Cough - Likely bronchitis but need to r/o infectious process such as pneumonia. Doubt TB.  - HYDROcodone-homatropine (HYCODAN) 5-1.5 MG/5ML syrup; Take 5 mLs by mouth every 8 (eight) hours as needed for cough.  Dispense: 120 mL; Refill: 0 - DG Chest 2 View; Future - Will form further plan of care after x ray results.   Dorothyann Peng, NP

## 2016-02-16 ENCOUNTER — Telehealth: Payer: Self-pay | Admitting: Internal Medicine

## 2016-02-16 ENCOUNTER — Other Ambulatory Visit: Payer: Self-pay | Admitting: Adult Health

## 2016-02-16 MED ORDER — PREDNISONE 10 MG PO TABS: ORAL_TABLET | ORAL | 0 refills | Status: DC

## 2016-02-16 MED ORDER — DOXYCYCLINE HYCLATE 100 MG PO CAPS
100.0000 mg | ORAL_CAPSULE | Freq: Two times a day (BID) | ORAL | 0 refills | Status: DC
Start: 2016-02-16 — End: 2016-05-11

## 2016-02-16 MED FILL — predniSONE 10 MG TABS: 10 | 9 days supply | Qty: 21 | Fill #0

## 2016-02-16 MED FILL — DOXYCYCLINE HYCLATE 100 MG: 100 | 10 days supply | Qty: 20 | Fill #0

## 2016-02-16 NOTE — Telephone Encounter (Signed)
A user error has taken place: ERROR °

## 2016-03-17 ENCOUNTER — Encounter: Payer: Self-pay | Admitting: Adult Health

## 2016-04-05 ENCOUNTER — Encounter: Payer: 59 | Admitting: Internal Medicine

## 2016-04-28 ENCOUNTER — Encounter: Payer: Self-pay | Admitting: Internal Medicine

## 2016-05-05 ENCOUNTER — Other Ambulatory Visit: Payer: Self-pay | Admitting: Adult Health

## 2016-05-05 DIAGNOSIS — Z1231 Encounter for screening mammogram for malignant neoplasm of breast: Secondary | ICD-10-CM

## 2016-05-11 ENCOUNTER — Encounter: Payer: Self-pay | Admitting: Internal Medicine

## 2016-05-11 ENCOUNTER — Ambulatory Visit (AMBULATORY_SURGERY_CENTER): Payer: 59 | Admitting: Internal Medicine

## 2016-05-11 VITALS — BP 110/64 | HR 77 | Temp 99.3°F | Resp 20 | Ht 68.0 in | Wt 208.0 lb

## 2016-05-11 DIAGNOSIS — K5900 Constipation, unspecified: Secondary | ICD-10-CM

## 2016-05-11 DIAGNOSIS — D123 Benign neoplasm of transverse colon: Secondary | ICD-10-CM

## 2016-05-11 DIAGNOSIS — R109 Unspecified abdominal pain: Secondary | ICD-10-CM | POA: Diagnosis not present

## 2016-05-11 MED ORDER — SODIUM CHLORIDE 0.9 % IV SOLN: 500.0000 mL | INTRAVENOUS | Status: DC

## 2016-05-11 NOTE — Progress Notes (Signed)
Report to PACU, RN, vss, BBS= Clear.  

## 2016-05-11 NOTE — Op Note (Signed)
[facility] Patient Name: Jennifer Cardenas Procedure Date: 05/11/2016 2:20 PM MRN: [id number] Endoscopist: Docia Chuck. Henrene Pastor , MD Age: 41 Referring MD:  Date of Birth: 1975/12/13 Gender: Female Account #: [id number] Procedure:                Colonoscopy, with cold snare polypectomy x 1 Indications:              Abdominal pain, Rectal bleeding, Constipation Medicines:                Monitored Anesthesia Care Procedure:                Pre-Anesthesia Assessment:                           - Prior to the procedure, a History and Physical                            was performed, and patient medications and                            allergies were reviewed. The patient's tolerance of                            previous anesthesia was also reviewed. The risks                            and benefits of the procedure and the sedation                            options and risks were discussed with the patient.                            All questions were answered, and informed consent                            was obtained. Prior Anticoagulants: The patient has                            taken no previous anticoagulant or antiplatelet                            agents. ASA Grade Assessment: I - A normal, healthy                            patient. After reviewing the risks and benefits,                            the patient was deemed in satisfactory condition to                            undergo the procedure.                           After obtaining informed consent, the colonoscope  was passed under direct vision. Throughout the                            procedure, the patient's blood pressure, pulse, and                            oxygen saturations were monitored continuously. The                            Model CF-HQ190L [id number]) scope was introduced                            through the anus and advanced to the the cecum,     identified by appendiceal orifice and ileocecal                            valve. The ileocecal valve, appendiceal orifice,                            and rectum were photographed. The quality of the                            bowel preparation was excellent. The colonoscopy                            was performed without difficulty. The patient                            tolerated the procedure well. The bowel preparation                            used was SUPREP. Scope In: 2:22:04 PM Scope Out: 2:42:31 PM Scope Withdrawal Time: 0 hours 13 minutes 16 seconds  Total Procedure Duration: 0 hours 20 minutes 27 seconds  Findings:                 A 1 mm polyp was found in the transverse colon. The                            polyp was removed with a cold snare. Resection and                            retrieval were complete.                           The exam was otherwise without abnormality on                            direct and retroflexion views. Complications:            No immediate complications. Estimated blood loss:                            None. Estimated Blood Loss:     Estimated blood loss: none. Impression:               -  One 1 mm polyp in the transverse colon, removed                            with a cold snare. Resected and retrieved.                           - The examination was otherwise normal on direct                            and retroflexion views. Recommendation:           - Repeat colonoscopy in 5-10 years for surveillance.                           - Patient has a contact number available for                            emergencies. The signs and symptoms of potential                            delayed complications were discussed with the                            patient. Return to normal activities tomorrow.                            Written discharge instructions were provided to the                            patient.                           -  Resume previous diet.                           - Continue present medications.                           - Await pathology results. Docia Chuck. Henrene Pastor, MD 05/11/2016 2:46:46 PM This report has been signed electronically.

## 2016-05-11 NOTE — Patient Instructions (Signed)
YOU HAD AN ENDOSCOPIC PROCEDURE TODAY AT THE [facility]:   Refer to the procedure report that was given to you for any specific questions about what was found during the examination.  If the procedure report does not answer your questions, please call your gastroenterologist to clarify.  If you requested that your care partner not be given the details of your procedure findings, then the procedure report has been included in a sealed envelope for you to review at your convenience later.  YOU SHOULD EXPECT: Some feelings of bloating in the abdomen. Passage of more gas than usual.  Walking can help get rid of the air that was put into your GI tract during the procedure and reduce the bloating. If you had a lower endoscopy (such as a colonoscopy or flexible sigmoidoscopy) you may notice spotting of blood in your stool or on the toilet paper. If you underwent a bowel prep for your procedure, you may not have a normal bowel movement for a few days.  Please Note:  You might notice some irritation and congestion in your nose or some drainage.  This is from the oxygen used during your procedure.  There is no need for concern and it should clear up in a day or so.  SYMPTOMS TO REPORT IMMEDIATELY:   Following lower endoscopy (colonoscopy or flexible sigmoidoscopy):  Excessive amounts of blood in the stool  Significant tenderness or worsening of abdominal pains  Swelling of the abdomen that is new, acute  Fever of 100F or higher  For urgent or emergent issues, a gastroenterologist can be reached at any hour by calling [phone].   DIET:  We do recommend a small meal at first, but then you may proceed to your regular diet.  Drink plenty of fluids but you should avoid alcoholic beverages for 24 hours.  MEDICATIONS: Continue present medications.  ACTIVITY:  You should plan to take it easy for the rest of today and you should NOT DRIVE or use heavy machinery until tomorrow (because of the  sedation medicines used during the test).    FOLLOW UP: Our staff will call the number listed on your records the next business day following your procedure to check on you and address any questions or concerns that you may have regarding the information given to you following your procedure. If we do not reach you, we will leave a message.  However, if you are feeling well and you are not experiencing any problems, there is no need to return our call.  We will assume that you have returned to your regular daily activities without incident.  If any biopsies were taken you will be contacted by phone or by letter within the next 1-3 weeks.  Please call us at [phone] if you have not heard about the biopsies in 3 weeks.    SIGNATURES/CONFIDENTIALITY: You and/or your care partner have signed paperwork which will be entered into your electronic medical record.  These signatures attest to the fact that that the information above on your After Visit Summary has been reviewed and is understood.  Full responsibility of the confidentiality of this discharge information lies with you and/or your care-partner. 

## 2016-05-11 NOTE — Progress Notes (Signed)
Called to room to assist during endoscopic procedure.  Patient ID and intended procedure confirmed with present staff. Received instructions for my participation in the procedure from the performing physician.  

## 2016-05-12 ENCOUNTER — Ambulatory Visit
Admission: RE | Admit: 2016-05-12 | Discharge: 2016-05-12 | Disposition: A | Discharge status: Home / Self Care | Payer: 59 | Source: Ambulatory Visit | Attending: Adult Health | Admitting: Adult Health

## 2016-05-12 ENCOUNTER — Telehealth: Payer: Self-pay

## 2016-05-12 DIAGNOSIS — Z1231 Encounter for screening mammogram for malignant neoplasm of breast: Secondary | ICD-10-CM | POA: Diagnosis not present

## 2016-05-12 NOTE — Telephone Encounter (Signed)
  Follow up Call-  Call back number 05/11/2016  Post procedure Call Back phone  # [phone]  Permission to leave phone message Yes  Some recent data might be hidden    Patient was called for follow up after her procedure on 05/11/2016. No answer at the number given for follow up phone call. I was not able to leave a message due to mail box was full.

## 2016-05-12 NOTE — Telephone Encounter (Signed)
  Follow up Call-  Call back number 05/11/2016  Post procedure Call Back phone  # [phone]  Permission to leave phone message Yes  Some recent data might be hidden    Patient was called for follow up after her procedure on 05/11/2016. No answer at the number given for follow up phone call. Mail box was full so I was not able to leave a message.

## 2016-05-17 ENCOUNTER — Encounter: Payer: Self-pay | Admitting: Internal Medicine

## 2016-06-20 DIAGNOSIS — Z23 Encounter for immunization: Secondary | ICD-10-CM | POA: Diagnosis not present

## 2016-06-20 DIAGNOSIS — L700 Acne vulgaris: Secondary | ICD-10-CM | POA: Diagnosis not present

## 2016-08-17 MED FILL — SUMATRIPTAN SUCC 100 MG TAB: 100 | 30 days supply | Qty: 9 | Fill #1

## 2016-09-25 ENCOUNTER — Emergency Department (HOSPITAL_COMMUNITY)
Admission: EM | Admit: 2016-09-25 | Discharge: 2016-09-26 | Disposition: A | Discharge status: Home / Self Care | Payer: 59 | Attending: Emergency Medicine | Admitting: Emergency Medicine

## 2016-09-25 ENCOUNTER — Ambulatory Visit (HOSPITAL_COMMUNITY)
Admission: EM | Admit: 2016-09-25 | Discharge: 2016-09-25 | Disposition: A | Discharge status: Nursing Home (Includes ICF) | Payer: 59

## 2016-09-25 ENCOUNTER — Emergency Department (HOSPITAL_COMMUNITY): Payer: 59

## 2016-09-25 ENCOUNTER — Encounter (HOSPITAL_COMMUNITY): Payer: Self-pay | Admitting: Emergency Medicine

## 2016-09-25 DIAGNOSIS — R002 Palpitations: Secondary | ICD-10-CM | POA: Diagnosis not present

## 2016-09-25 DIAGNOSIS — R079 Chest pain, unspecified: Secondary | ICD-10-CM | POA: Diagnosis not present

## 2016-09-25 NOTE — ED Triage Notes (Signed)
Pt presents to ED for assessment of left sided chest pain starting today after having palpitations and "runs of feeling like I was skipping a beat" all weekend.  Pt c/o some nausea, denies v/d.  C/o intermittent headache, with hx of same.   Pt denies back pain, denies light-headedness.  C/o SOB intermittently, mostly with exertion.

## 2016-09-26 DIAGNOSIS — R002 Palpitations: Secondary | ICD-10-CM | POA: Diagnosis not present

## 2016-09-26 LAB — CBC
HCT: 38.1 % (ref 36.0–46.0)
Hemoglobin: 12.6 g/dL (ref 12.0–15.0)
MCH: 28.7 pg (ref 26.0–34.0)
MCHC: 33.1 g/dL (ref 30.0–36.0)
MCV: 86.8 fL (ref 78.0–100.0)
PLATELETS: 200 10*3/uL (ref 150–400)
RBC: 4.39 MIL/uL (ref 3.87–5.11)
RDW: 14.3 % (ref 11.5–15.5)
WBC: 10.5 10*3/uL (ref 4.0–10.5)

## 2016-09-26 LAB — BASIC METABOLIC PANEL
Anion gap: 7 (ref 5–15)
BUN: 8 mg/dL (ref 6–20)
CHLORIDE: 103 mmol/L (ref 101–111)
CO2: 28 mmol/L (ref 22–32)
CREATININE: 0.85 mg/dL (ref 0.44–1.00)
Calcium: 9.4 mg/dL (ref 8.9–10.3)
GFR calc Af Amer: >60 mL/min (ref >60)
GFR calc non Af Amer: >60 mL/min (ref >60)
Glucose, Bld: 88 mg/dL (ref 65–99)
Potassium: 3.7 mmol/L (ref 3.5–5.1)
SODIUM: 138 mmol/L (ref 135–145)

## 2016-09-26 LAB — I-STAT TROPONIN, ED: Troponin i, poc: 0 ng/mL (ref 0.00–0.08)

## 2016-09-26 LAB — I-STAT BETA HCG BLOOD, ED (MC, WL, AP ONLY): I-stat hCG, quantitative: <5 m[IU]/mL (ref <5)

## 2016-09-26 LAB — TSH: TSH: 1.594 u[IU]/mL (ref 0.350–4.500)

## 2016-09-26 LAB — MAGNESIUM: MAGNESIUM: 2 mg/dL (ref 1.7–2.4)

## 2016-09-26 LAB — D-DIMER, QUANTITATIVE (NOT AT ARMC): D DIMER QUANT: 0.3 ug{FEU}/mL (ref 0.00–0.50)

## 2016-09-26 MED ORDER — SODIUM CHLORIDE 0.9 % IV BOLUS (SEPSIS)
1000.0000 mL | Freq: Once | INTRAVENOUS | Status: AC
  Administered 2016-09-26: 1000 mL via INTRAVENOUS

## 2016-09-26 NOTE — ED Notes (Signed)
PT states understanding of care given, follow up care, and medication prescribed. PT ambulated from ED to car with a steady gait. 

## 2016-09-26 NOTE — Discharge Instructions (Signed)
Your labs, EKG and chest x-ray today were normal. I recommend close follow-up with your primary care physician. You may also see a cardiologist who may discuss using a Holter monitor to pick up any arrhythmia.

## 2016-09-26 NOTE — ED Provider Notes (Signed)
TIME SEEN: 12:49 AM  By signing my name below, I, Margit Banda, attest that this documentation has been prepared under the direction and in the presence of Jozalyn Baglio, Delice Bison, DO. Electronically Signed: Margit Banda, ED Scribe. 09/26/16. 12:54 AM.  CHIEF COMPLAINT: Chest Pain  HPI: Jennifer Cardenas is a 41 y.o. female who presents to the Emergency Department complaining of heart palpations for ~ 1 week ago, worsening the last few days. Associated sx include "sore" CP, nausea, HA, SOB with exertion, lightheadedness. Her HR was 120 when she was sitting at her desk at work while at rest and she did not feel anxious. She has been eating and drinking okay. No hx of PE or DVT. No history of arrhythmia. LMP was last week. Her father has Afib. Pt denies vomiting, diarrhea, syncopal episodes or near syncope, heavy vaginal bleeding, melena and blood in stool. Denies heavy caffeine use. No stimulant use. No illicit drug use.   ROS: See HPI Constitutional: no fever  Eyes: no drainage  ENT: no runny nose   Cardiovascular: + chest pain  Resp: + SOB  GI: no vomiting GU: no dysuria Integumentary: no rash  Allergy: no hives  Musculoskeletal: no leg swelling  Neurological: no slurred speech ROS otherwise negative  PAST MEDICAL HISTORY/PAST SURGICAL HISTORY:  Past Medical History:  Diagnosis Date  . Acne   . Allergy   . Anxiety   . Arthritis   . Constipation   . Herpes simplex    H/O HSV 1   and on leg  . Hx of migraines   . Hyperemesis   . STD (sexually transmitted disease)    HSV 1    MEDICATIONS:  Prior to Admission medications   Medication Sig Start Date End Date Taking? Authorizing Provider  metoCLOPramide (REGLAN) 10 MG tablet Take 1 tablet (10 mg total) by mouth 4 (four) times daily. Patient taking differently: Take 10 mg by mouth every 8 (eight) hours as needed for nausea or vomiting.  02/03/16  Yes Jaffe, Adam R, DO  SUMAtriptan (IMITREX) 100 MG tablet Take 1 tablet at earliest  onset of migraine.  May repeat once in 2 hours if headache persists or recurs. 02/03/16  Yes Jaffe, Adam R, DO  aluminum chloride (DRYSOL) 20 % external solution Apply topically at bedtime. Patient not taking: Reported on 09/26/2016 11/30/15   Dorothyann Peng, NP  Diclofenac Sodium (PENNSAID) 2 % SOLN Place 2 application onto the skin 2 (two) times daily. Patient not taking: Reported on 09/26/2016 08/31/15   Lyndal Pulley, DO  FeFum-FePoly-FA-B Cmp-C-Biot (INTEGRA PLUS) CAPS Take 1 capsule by mouth daily. Patient not taking: Reported on 09/26/2016 11/30/15   Dorothyann Peng, NP  LINZESS 145 MCG CAPS capsule TAKE 1 CAPSULE BY MOUTH ONCE DAILY Patient not taking: Reported on 09/26/2016 04/28/15   Kennyth Arnold, FNP  valACYclovir (VALTREX) 1000 MG tablet Take one tablet by mouth once daily as needed. Patient not taking: Reported on 09/26/2016 11/30/15   Dorothyann Peng, NP    ALLERGIES:  Allergies  Allergen Reactions  . Benzoyl Peroxide Swelling    For acne     SOCIAL HISTORY:  Social History  Substance Use Topics  . Smoking status: Never Smoker  . Smokeless tobacco: Never Used  . Alcohol use 0.0 oz/week     Comment: Occasionally     FAMILY HISTORY: Family History  Problem Relation Age of Onset  . Bipolar disorder Mother   . Hepatitis C Mother   . Colon polyps  Father   . Atrial fibrillation Father   . Hypertension Father   . Hyperlipidemia Father   . Breast cancer Maternal Grandmother   . Leukemia Maternal Grandmother   . Diabetes Other     EXAM: BP 131/84   Pulse 76   Temp 98.4 F (36.9 C) (Oral)   Resp 16   LMP 09/20/2016   SpO2 100%  CONSTITUTIONAL: Alert and oriented and responds appropriately to questions. Well-appearing; well-nourished HEAD: Normocephalic EYES: Conjunctivae clear, pupils appear equal, EOMI ENT: normal nose; moist mucous membranes NECK: Supple, no meningismus, no nuchal rigidity, no LAD  CARD: RRR; S1 and S2 appreciated; no murmurs, no clicks, no rubs, no  gallops RESP: Normal chest excursion without splinting or tachypnea; breath sounds clear and equal bilaterally; no wheezes, no rhonchi, no rales, no hypoxia or respiratory distress, speaking full sentences ABD/GI: Normal bowel sounds; non-distended; soft, non-tender, no rebound, no guarding, no peritoneal signs, no hepatosplenomegaly BACK:  The back appears normal and is non-tender to palpation, there is no CVA tenderness EXT: Normal ROM in all joints; non-tender to palpation; no edema; normal capillary refill; no cyanosis, no calf tenderness or swelling    SKIN: Normal color for age and race; warm; no rash NEURO: Moves all extremities equally PSYCH: The patient's mood and manner are appropriate. Grooming and personal hygiene are appropriate.  MEDICAL DECISION MAKING: Patient here with complaints of palpitations, sore feeling chest pain and shortness of breath. She has no risk factors for ACS. No risk factors for PE. Doubt pneumonia. She has had intermittent PVCs and PACs here in the emergency department. In normal sinus rhythm. Will obtain labs, give IV fluids and continue to monitor patient on the cardiac monitor.  ED PROGRESS:   3:25 AM  Patient's labs completely unremarkable. Hemoglobin 12.6, normal left lites, normal TSH, negative troponin, negative pregnancy test and negative d-dimer. She reports intermittent episodes of palpitations while here but we have not witnessed any thing other than occasional PACs, PVCs. No arrhythmia. No heart rates noted over 100. I feel she is safe to be discharged with outpatient follow-up with her primary care physician as well as cardiology. I do not think that this is ACS. I do not think this is PE. Do not think this is dissection. Discussed return precautions. Recommend she increase her water intake at home. Recommended she avoided caffeine and any stimulants. She is comfortable with this plan.   At this time, I do not feel there is any life-threatening  condition present. I have reviewed and discussed all results (EKG, imaging, lab, urine as appropriate) and exam findings with patient/family. I have reviewed nursing notes and appropriate previous records.  I feel the patient is safe to be discharged home without further emergent workup and can continue workup as an outpatient as needed. Discussed usual and customary return precautions. Patient/family verbalize understanding and are comfortable with this plan.  Outpatient follow-up has been provided if needed. All questions have been answered.      EKG Interpretation  Date/Time:  Monday September 25 2016 19:56:28 EDT Ventricular Rate:  84 PR Interval:  126 QRS Duration: 84 QT Interval:  354 QTC Calculation: 418 R Axis:   40 Text Interpretation:  Sinus rhythm with Premature supraventricular complexes Otherwise normal ECG No old tracing to compare Confirmed by Dennis Killilea, Cyril Mourning [phone]) on 09/26/2016 12:47:02 AM       I personally performed the services described in this documentation, which was scribed in my presence. The recorded information has been  reviewed and is accurate.     Colbe Viviano, Delice Bison, DO 09/26/16 [phone]

## 2016-09-28 ENCOUNTER — Ambulatory Visit (INDEPENDENT_AMBULATORY_CARE_PROVIDER_SITE_OTHER): Payer: 59 | Admitting: Adult Health

## 2016-09-28 VITALS — BP 126/74 | Ht 68.0 in | Wt 206.4 lb

## 2016-09-28 DIAGNOSIS — R002 Palpitations: Secondary | ICD-10-CM

## 2016-09-28 NOTE — Progress Notes (Signed)
Subjective:    Patient ID: Jennifer Cardenas, female    DOB: 06-01-1975, 41 y.o.   MRN: [id number]  HPI  41 year old female who  has a past medical history of Acne; Allergy; Anxiety; Arthritis; Constipation; Herpes simplex; migraines; Hyperemesis; and STD (sexually transmitted disease). She presents to the office today for ER follow up.   Per ER note   41 y.o. female who presents to the Emergency Department complaining of heart palpations for ~ 1 week ago, worsening the last few days. Associated sx include "sore" CP, nausea, HA, SOB with exertion, lightheadedness. Her HR was 120 when she was sitting at her desk at work while at rest and she did not feel anxious. She has been eating and drinking okay. No hx of PE or DVT. No history of arrhythmia. LMP was last week. Her father has Afib. Pt denies vomiting, diarrhea, syncopal episodes or near syncope, heavy vaginal bleeding, melena and blood in stool. Denies heavy caffeine use. No stimulant use. No illicit drug use.  Labs were unremarkable. EKG showed PVC's and chest x ray was normal.   She has an appointment with Cardiology in 5 days.   Today in the office she reports that since being discharged from the ER she has had less occurances of skipped beats, palpitations , tachycardia, dizziness,  and SOB with exertion but she has had some occurences  Symptoms. She does endorse that at one point 2 days ago she felt like she was going to pass out while having sex.   Review of Systems See HPI   Past Medical History:  Diagnosis Date  . Acne   . Allergy   . Anxiety   . Arthritis   . Constipation   . Herpes simplex    H/O HSV 1   and on leg  . Hx of migraines   . Hyperemesis   . STD (sexually transmitted disease)    HSV 1    Social History   Social History  . Marital status: Single    Spouse name: N/A  . Number of children: 2  . Years of education: N/A   Occupational History  . RN    Social History Main Topics  . Smoking status:  Never Smoker  . Smokeless tobacco: Never Used  . Alcohol use 0.0 oz/week     Comment: Occasionally   . Drug use: No  . Sexual activity: No     Comment: LoSeasonique   Other Topics Concern  . 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 ( 110 y/o)           Past Surgical History:  Procedure Laterality Date  . BREAST BIOPSY Right    benign core biopsy  . BUNIONECTOMY Bilateral    1996 and 1998  . excision of right breast mass Right    benign  . UMBILICAL HERNIA REPAIR      Family History  Problem Relation Age of Onset  . Bipolar disorder Mother   . Hepatitis C Mother   . Colon polyps Father   . Atrial fibrillation Father   . Hypertension Father   . Hyperlipidemia Father   . Breast cancer Maternal Grandmother   . Leukemia Maternal Grandmother   . Diabetes Other     Allergies  Allergen Reactions  . Benzoyl Peroxide Swelling    For acne     Current Outpatient  Prescriptions on File Prior to Visit  Medication Sig Dispense Refill  . metoCLOPramide (REGLAN) 10 MG tablet Take 1 tablet (10 mg total) by mouth 4 (four) times daily. (Patient taking differently: Take 10 mg by mouth every 8 (eight) hours as needed for nausea or vomiting. ) 30 tablet 1  . SUMAtriptan (IMITREX) 100 MG tablet Take 1 tablet at earliest onset of migraine.  May repeat once in 2 hours if headache persists or recurs. 10 tablet 2   Current Facility-Administered Medications on File Prior to Visit  Medication Dose Route Frequency Provider Last Rate Last Dose  . 0.9 %  sodium chloride infusion  500 mL Intravenous Continuous Irene Shipper, MD        BP 126/74 (BP Location: Left Arm, Patient Position: Sitting, Cuff Size: Normal)   Ht 5\' 8"  (1.727 m)   Wt 206 lb 6.4 oz (93.6 kg)   LMP 09/20/2016   BMI 31.38 kg/m       Objective:   Physical Exam  Constitutional: She is oriented to person, place, and time. She appears well-developed and  well-nourished. No distress.  HENT:  Head: Normocephalic and atraumatic.  Right Ear: External ear normal.  Left Ear: External ear normal.  Nose: Nose normal.  Mouth/Throat: Oropharynx is clear and moist. No oropharyngeal exudate.  Cardiovascular: Normal rate, regular rhythm, normal heart sounds, intact distal pulses and normal pulses.   Occasional extrasystoles are present. PMI is not displaced.  Exam reveals no gallop and no friction rub.   No murmur heard. Pulmonary/Chest: Effort normal and breath sounds normal. No respiratory distress. She has no wheezes. She has no rales. She exhibits no tenderness.  Abdominal: Soft. Bowel sounds are normal. She exhibits no distension and no mass. There is no tenderness. There is no rebound and no guarding.  Musculoskeletal: Normal range of motion. She exhibits no edema, tenderness or deformity.  Neurological: She is alert and oriented to person, place, and time. She has normal reflexes. She displays normal reflexes. No cranial nerve deficit. She exhibits normal muscle tone. Coordination normal.  Skin: Skin is warm and dry. No rash noted. She is not diaphoretic. No erythema. No pallor.  Psychiatric: She has a normal mood and affect. Her behavior is normal. Judgment and thought content normal.  Nursing note and vitals reviewed.     Assessment & Plan:  1. Palpitations occassional PVC's while in the office today  - We will await cardiology consult for further treatment plan - Return precautions reviewed.   Dorothyann Peng, NP

## 2016-10-03 ENCOUNTER — Encounter: Payer: Self-pay | Admitting: Cardiovascular Disease

## 2016-10-03 ENCOUNTER — Ambulatory Visit (INDEPENDENT_AMBULATORY_CARE_PROVIDER_SITE_OTHER): Payer: 59 | Admitting: Cardiovascular Disease

## 2016-10-03 VITALS — BP 122/74 | HR 82 | Ht 68.0 in | Wt 207.0 lb

## 2016-10-03 DIAGNOSIS — R0789 Other chest pain: Secondary | ICD-10-CM | POA: Diagnosis not present

## 2016-10-03 DIAGNOSIS — R002 Palpitations: Secondary | ICD-10-CM

## 2016-10-03 NOTE — Assessment & Plan Note (Signed)
Jennifer Cardenas has been expressing palpitations which are symptomatic over the last 2-3 weeks. She's had a couple episodes of dizziness. She was evaluated in the emergency room with a negative workup. I'm going to get a 30 day event monitor to further evaluate. Her TSH was normal and she has limited her caffeine intake over the last several weeks.

## 2016-10-03 NOTE — Patient Instructions (Signed)
Medication Instructions: Your physician recommends that you continue on your current medications as directed. Please refer to the Current Medication list given to you today.   Testing/Procedures: Your physician has requested that you have an echocardiogram. Echocardiography is a painless test that uses sound waves to create images of your heart. It provides your doctor with information about the size and shape of your heart and how well your heart's chambers and valves are working. This procedure takes approximately one hour. There are no restrictions for this procedure.  Your physician has recommended that you wear a 30 day event monitor. Event monitors are medical devices that record the heart's electrical activity. Doctors most often [place] these monitors to diagnose arrhythmias. Arrhythmias are problems with the speed or rhythm of the heartbeat. The monitor is a small, portable device. You can wear one while you do your normal daily activities. This is usually used to diagnose what is causing palpitations/syncope (passing out).  Your physician has requested that you have an exercise tolerance test. For further information please visit [email or web address]. Please also follow instruction sheet, as given.  Follow-Up: Your physician recommends that you schedule a follow-up appointment after testing with Dr. Gwenlyn Found.  If you need a refill on your cardiac medications before your next appointment, please call your pharmacy.

## 2016-10-03 NOTE — Progress Notes (Signed)
10/03/2016 Jennifer Cardenas [facility]   1975/05/06  [id number]  Primary Physician Dorothyann Peng, NP Primary Cardiologist: Lorretta Harp MD Renae Gloss  HPI:  Jennifer Cardenas is a 42 year old mildly overweight divorced African-American female mother of 2 children who works as a traveling Marine scientist. She was referred by her PCP because of new onset palpitation symptoms associated with atypical chest pain and shortness of breath. She has no cardiac risk factors. She's had palpitations the last 2-3 weeks which she is symptomatic from. She has had occasional dizziness, atypical chest pain and some shortness of breath. She was evaluated in the emergency room recently on 09/26/16 only occasional PVCs and PACs. She has limited her caffeine intake or last several weeks as well. Her blood work was unremarkable including normal TSH.   Current Outpatient Prescriptions  Medication Sig Dispense Refill  . metoCLOPramide (REGLAN) 10 MG tablet Take 1 tablet (10 mg total) by mouth 4 (four) times daily. (Patient taking differently: Take 10 mg by mouth every 8 (eight) hours as needed for nausea or vomiting. ) 30 tablet 1  . SUMAtriptan (IMITREX) 100 MG tablet Take 1 tablet at earliest onset of migraine.  May repeat once in 2 hours if headache persists or recurs. 10 tablet 2  . valACYclovir (VALTREX) 1000 MG tablet Take 1,000 mg by mouth daily as needed.     Current Facility-Administered Medications  Medication Dose Route Frequency Provider Last Rate Last Dose  . 0.9 %  sodium chloride infusion  500 mL Intravenous Continuous Irene Shipper, MD        Allergies  Allergen Reactions  . Benzoyl Peroxide Swelling    For acne     Social History   Social History  . Marital status: Single    Spouse name: N/A  . Number of children: 2  . Years of education: N/A   Occupational History  . RN    Social History Main Topics  . Smoking status: Never Smoker  . Smokeless tobacco: Never Used  . Alcohol use 0.0 oz/week       Comment: Occasionally   . Drug use: No  . Sexual activity: No     Comment: LoSeasonique   Other Topics Concern  . 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  Cardiovascular: negative for chest pain, dyspnea on exertion, edema, orthopnea, palpitations, paroxysmal nocturnal dyspnea or shortness of breath Dermatological: negative for rash Respiratory: negative for cough or wheezing Urologic: negative for hematuria Abdominal: negative for nausea, vomiting, diarrhea, bright red blood per rectum, melena, or hematemesis Neurologic: negative for visual changes, syncope, or dizziness All other systems reviewed and are otherwise negative except as noted above.    Blood pressure 122/74, pulse 82, height 5\' 8"  (1.727 m), weight 207 lb (93.9 kg), last menstrual period 09/20/2016.  General appearance: alert and no distress Neck: no adenopathy, no carotid bruit, no JVD, supple, symmetrical, trachea midline and thyroid not enlarged, symmetric, no tenderness/mass/nodules Lungs: clear to auscultation bilaterally Heart: regular rate and rhythm, S1, S2 normal, no murmur, click, rub or gallop Extremities: extremities normal, atraumatic, no cyanosis or edema  EKG sinus rhythm and a 2 without ST or T-wave changes. I Personally reviewed this EKG.  ASSESSMENT AND  PLAN:   Palpitations Jennifer. Pratte has been expressing palpitations which are symptomatic over the last 2-3 weeks. She's had a couple episodes of dizziness. She was evaluated in the emergency room with a negative workup. I'm going to get a 30 day event monitor to further evaluate. Her TSH was normal and she has limited her caffeine intake over the last several weeks.  Atypical chest pain Jennifer. Williamsen has complained of atypical chest pain in association with palpitations and some dyspnea  over the last 2-3 weeks. She has minimal cardiac risk factors. I am going to get a 2-D echo and routine GXT to further evaluate.      Lorretta Harp MD FACP,FACC,FAHA, [facility] 10/03/2016 2:02 PM

## 2016-10-03 NOTE — Assessment & Plan Note (Signed)
Jennifer Cardenas has complained of atypical chest pain in association with palpitations and some dyspnea over the last 2-3 weeks. She has minimal cardiac risk factors. I am going to get a 2-D echo and routine GXT to further evaluate.

## 2016-10-09 ENCOUNTER — Encounter: Payer: Self-pay | Admitting: Adult Health

## 2016-10-09 MED ORDER — VALACYCLOVIR HCL 1 G PO TABS: 1000.0000 mg | ORAL_TABLET | Freq: Every day | ORAL | 1 refills | Status: DC | PRN

## 2016-10-09 MED FILL — valACYclovir HCL 1 GM TABS: 1 | 30 days supply | Qty: 30 | Fill #0

## 2016-10-18 ENCOUNTER — Other Ambulatory Visit: Payer: Self-pay

## 2016-10-18 ENCOUNTER — Ambulatory Visit (INDEPENDENT_AMBULATORY_CARE_PROVIDER_SITE_OTHER): Payer: 59

## 2016-10-18 ENCOUNTER — Ambulatory Visit (HOSPITAL_COMMUNITY): Payer: 59 | Attending: Internal Medicine

## 2016-10-18 DIAGNOSIS — R002 Palpitations: Secondary | ICD-10-CM | POA: Diagnosis not present

## 2016-10-18 DIAGNOSIS — R0789 Other chest pain: Secondary | ICD-10-CM | POA: Insufficient documentation

## 2016-10-19 ENCOUNTER — Telehealth (HOSPITAL_COMMUNITY): Payer: Self-pay

## 2016-10-19 NOTE — Telephone Encounter (Signed)
Encounter complete. 

## 2016-10-24 ENCOUNTER — Ambulatory Visit (HOSPITAL_COMMUNITY)
Admission: RE | Admit: 2016-10-24 | Discharge: 2016-10-24 | Disposition: A | Discharge status: Home / Self Care | Payer: 59 | Source: Ambulatory Visit | Attending: Cardiovascular Disease | Admitting: Cardiovascular Disease

## 2016-10-24 DIAGNOSIS — R002 Palpitations: Secondary | ICD-10-CM | POA: Insufficient documentation

## 2016-10-24 DIAGNOSIS — R0789 Other chest pain: Secondary | ICD-10-CM | POA: Insufficient documentation

## 2016-10-24 LAB — EXERCISE TOLERANCE TEST
CHL RATE OF PERCEIVED EXERTION: 18
CSEPED: 6 min
CSEPEW: 7 METS
Exercise duration (sec): 1 s
MPHR: 177 {beats}/min
Peak HR: 176 {beats}/min
Percent HR: 99 %
Rest HR: 93 {beats}/min

## 2016-12-05 ENCOUNTER — Ambulatory Visit: Payer: 59 | Admitting: Cardiovascular Disease

## 2016-12-11 ENCOUNTER — Ambulatory Visit (INDEPENDENT_AMBULATORY_CARE_PROVIDER_SITE_OTHER): Payer: 59 | Admitting: Physician Assistant

## 2016-12-11 ENCOUNTER — Encounter: Payer: Self-pay | Admitting: Physician Assistant

## 2016-12-11 VITALS — BP 110/80 | HR 86 | Ht 68.0 in | Wt 210.0 lb

## 2016-12-11 DIAGNOSIS — R002 Palpitations: Secondary | ICD-10-CM

## 2016-12-11 MED ORDER — METOPROLOL SUCCINATE ER 25 MG PO TB24: ORAL_TABLET | ORAL | 11 refills | Status: DC

## 2016-12-11 MED FILL — METOPROLOL SUCC ER 25 MG TA: 25 | 30 days supply | Qty: 30 | Fill #0

## 2016-12-11 NOTE — Patient Instructions (Signed)
Your physician has recommended you make the following change in your medication:  -- START metoprolol succinate  -- take 1/2 tablet daily (12.5mg )  -- increase to 1 tablet (25mg ) daily after 3 days If tolerated  Your physician wants you to follow-up in: 6 months with Dr. Gwenlyn Found (per [place], [place] you can follow up in 1 year if you are doing OK) You will receive a reminder letter in the mail two months in advance. If you don't receive a letter, please call our office to schedule the follow-up appointment.

## 2016-12-11 NOTE — Progress Notes (Signed)
Cardiology Office Note   Date:  12/11/2016   ID:  Jennifer Cardenas, DOB 10/17/1975, MRN [id number]  PCP:  Dorothyann Peng, NP  Cardiologist:  Dr. Gwenlyn Found, 10/03/2016  Rosaria Ferries, PA-C   Chief Complaint  Patient presents with  . Follow-up    F/F after stress test.    History of Present Illness: Jennifer Cardenas is a 41 y.o. female with a history of palpitations (PACs and PVCs), atypical CP.  07/10 office visit, Dr. Gwenlyn Found planned event monitor, echocardiogram and POET.  Jennifer Cardenas presents for cardiology follow up.   She has increased stress recently. 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  She is still getting the palpitations. She limits caffeine and does not smoke. No longer takes Excedrin for migraines.   She has a chronically high HR, frequently in the 90s at rest. She ery activewas relieved to learn that she did not have any significant arrhythmia while she was wearing the monitor. She does note however, that she was not very active while she was wearing the monitor, following instructions.  She has not had any chest pain with exertion. She has been exercising and has tolerated that well. She denies increased dyspnea on exertion, orthopnea or PND. She does not have lower extremity edema.   Past Medical History:  Diagnosis Date  . Acne   . Allergy   . Anxiety   . Arthritis   . Constipation   . Herpes simplex    H/O HSV 1   and on leg  . Hx of migraines   . Hyperemesis   . STD (sexually transmitted disease)    HSV 1    Past Surgical History:  Procedure Laterality Date  . BREAST BIOPSY Right    benign core biopsy  . BUNIONECTOMY Bilateral    1996 and 1998  . excision of right breast mass Right    benign  . UMBILICAL HERNIA REPAIR      Current Outpatient Prescriptions  Medication Sig Dispense Refill  . metoCLOPramide (REGLAN) 10 MG tablet Take 1 tablet (10 mg total) by mouth 4 (four) times daily. (Patient taking  differently: Take 10 mg by mouth every 8 (eight) hours as needed for nausea or vomiting. ) 30 tablet 1  . SUMAtriptan (IMITREX) 100 MG tablet Take 1 tablet at earliest onset of migraine.  May repeat once in 2 hours if headache persists or recurs. (Patient taking differently: Take 100 mg by mouth as needed. Take 1 tablet at earliest onset of migraine.  May repeat once in 2 hours if headache persists or recurs.) 10 tablet 2  . valACYclovir (VALTREX) 1000 MG tablet Take 1 tablet (1,000 mg total) by mouth daily as needed. 90 tablet 1   Current Facility-Administered Medications  Medication Dose Route Frequency Provider Last Rate Last Dose  . 0.9 %  sodium chloride infusion  500 mL Intravenous Continuous Irene Shipper, MD        Allergies:   Benzoyl peroxide    Social History:  The patient  reports that she has never smoked. She has never used smokeless tobacco. She reports that she drinks alcohol. She reports that she does not use drugs.   Family History:  The patient's family history includes Atrial fibrillation in her father; Bipolar disorder in her mother; Breast cancer in her maternal grandmother; Colon polyps in her father; Diabetes in her other; Hepatitis C in her mother; Hyperlipidemia in her father; Hypertension in her father;  Leukemia in her maternal grandmother.    ROS:  Please see the history of present illness. All other systems are reviewed and negative.    PHYSICAL EXAM: VS:  BP 110/80   Pulse 86   Ht 5\' 8"  (1.727 m)   Wt 210 lb (95.3 kg)   BMI 31.93 kg/m  , BMI Body mass index is 31.93 kg/m. GEN: Well nourished, well developed, female in no acute distress  HEENT: normal for age  Neck: no JVD, no carotid bruit, no masses Cardiac: RRR; soft murmur, no rubs, or gallops Respiratory:  clear to auscultation bilaterally, normal work of breathing GI: soft, nontender, nondistended, + BS MS: no deformity or atrophy; no edema; distal pulses are 2+ in all 4 extremities   Skin: warm  and dry, no rash Neuro:  Strength and sensation are intact Psych: euthymic mood, full affect   EKG:  EKG is not ordered today.  POET: 10/24/2016  Blood pressure demonstrated a hypertensive response to exercise.  There was no ST segment deviation noted during stress.  Hypertensive response to exercise. Rapid increase in heart rate is consistent with deconditioning. Otherwise normal ECG stress test without exercise-induced ischemic ST segment changes.  ECHO: 10/18/2016 Study Conclusions - Left ventricle: The cavity size was normal. Wall thickness was   normal. Systolic function was normal. The estimated ejection   fraction was in the range of 60% to 65%. Wall motion was normal;   there were no regional wall motion abnormalities. Left   ventricular diastolic function parameters were normal. - Left atrium: The atrium was normal in size. - Tricuspid valve: There was trivial regurgitation. - Pulmonary arteries: PA peak pressure: 15 mm Hg (S). - Inferior vena cava: The vessel was normal in size. The   respirophasic diameter changes were in the normal range (>= 50%),   consistent with normal central venous pressure. Impressions: - Normal study.  Cardiac event monitor: 10/18/2016 No atrial fibrillation.  Less than 1% of the time with ectopy.  Sinus tachycardia 19% of the time.  Recent Labs: 12/23/2015: ALT 12 09/26/2016: BUN 8; Creatinine, Ser 0.85; Hemoglobin 12.6; Magnesium 2.0; Platelets 200; Potassium 3.7; Sodium 138; TSH 1.594    Lipid Panel    Component Value Date/Time   CHOL 167 12/23/2015 0918   TRIG 64.0 12/23/2015 0918   HDL 46.00 12/23/2015 0918   CHOLHDL 4 12/23/2015 0918   VLDL 12.8 12/23/2015 0918   LDLCALC 109 (H) 12/23/2015 0918     Wt Readings from Last 3 Encounters:  12/11/16 210 lb (95.3 kg)  10/03/16 207 lb (93.9 kg)  09/28/16 206 lb 6.4 oz (93.6 kg)     Other studies Reviewed: Additional studies/ records that were reviewed today include: Office  notes and testing.  ASSESSMENT AND PLAN:  1.  Palpitations: I advised her that she was having PACs and PVCs, she is not having long runs and there is nothing that is going on that is harmful to her. Additionally, her echocardiogram was normal and she does not have any structural problems in her heart. The patient was relieved to learn this.  I advised that I would be happy to start a low dose of a beta blocker to see if that would help her palpitations. We discussed the fact that her blood pressures not very high and she may not tolerate this. She is willing to try and solve start her on it and see how she does with that.  No further cardiac workup is indicated. She does  not tolerate beta blocker that is okay. If she does, great.   Current medicines are reviewed at length with the patient today.  The patient does not have concerns regarding medicines.  The following changes have been made:  Add low-dose metoprolol  Labs/ tests ordered today include:  No orders of the defined types were placed in this encounter.    Disposition:   FU with Dr. Gwenlyn Found in a year  Signed, Lenoard Aden  12/11/2016 [facility] Phone: [phone]; Fax: [phone]  This note was written with the assistance of speech recognition software. Please excuse any transcriptional errors.

## 2016-12-12 ENCOUNTER — Encounter: Payer: Self-pay | Admitting: Obstetrics and Gynecology

## 2016-12-12 ENCOUNTER — Ambulatory Visit (INDEPENDENT_AMBULATORY_CARE_PROVIDER_SITE_OTHER): Payer: 59 | Admitting: Obstetrics and Gynecology

## 2016-12-12 ENCOUNTER — Ambulatory Visit: Payer: 59 | Admitting: Obstetrics and Gynecology

## 2016-12-12 VITALS — BP 118/76 | HR 88 | Resp 16 | Ht 68.5 in | Wt 210.0 lb

## 2016-12-12 DIAGNOSIS — Z01419 Encounter for gynecological examination (general) (routine) without abnormal findings: Secondary | ICD-10-CM | POA: Diagnosis not present

## 2016-12-12 DIAGNOSIS — N760 Acute vaginitis: Secondary | ICD-10-CM

## 2016-12-12 NOTE — Progress Notes (Deleted)
41 y.o. [id number] Single African American female here for annual exam.    PCP:     No LMP recorded.           Sexually active: {yes no:314532}  The current method of family planning is vasectomy.    Exercising: {yes no:314532}  {types:19826} Smoker:  no  Health Maintenance: Pap:  10/02/14, Negative with neg HR HPV History of abnormal Pap:  Yes, 2000.  Colposcopy and no biopsy MMG:  05/12/16 BIRADS 1 negative/density b Colonoscopy:  05/11/16 Polyp, repeat 5-10 years BMD:   n/a  Result  n/a TDaP:  2010 Gardasil:   no HIV and Hep C: 10/02/14 Negative Screening Labs:  Hb today: ***, Urine today: ***   reports that she has never smoked. She has never used smokeless tobacco. She reports that she drinks alcohol. She reports that she does not use drugs.  Past Medical History:  Diagnosis Date  . Acne   . Allergy   . Anxiety   . Arthritis   . Constipation   . Herpes simplex    H/O HSV 1   and on leg  . Hx of migraines   . Hyperemesis   . STD (sexually transmitted disease)    HSV 1    Past Surgical History:  Procedure Laterality Date  . BREAST BIOPSY Right    benign core biopsy  . BUNIONECTOMY Bilateral    1996 and 1998  . excision of right breast mass Right    benign  . UMBILICAL HERNIA REPAIR      Current Outpatient Prescriptions  Medication Sig Dispense Refill  . metoCLOPramide (REGLAN) 10 MG tablet Take 1 tablet (10 mg total) by mouth 4 (four) times daily. (Patient taking differently: Take 10 mg by mouth every 8 (eight) hours as needed for nausea or vomiting. ) 30 tablet 1  . metoprolol succinate (TOPROL-XL) 25 MG 24 hr tablet Take 1/2 (12.5mg ) by mouth daily. Increase to 1 tablet daily after 3 days if tolerated 30 tablet 11  . SUMAtriptan (IMITREX) 100 MG tablet Take 1 tablet at earliest onset of migraine.  May repeat once in 2 hours if headache persists or recurs. (Patient taking differently: Take 100 mg by mouth as needed. Take 1 tablet at earliest onset of migraine.  May  repeat once in 2 hours if headache persists or recurs.) 10 tablet 2  . valACYclovir (VALTREX) 1000 MG tablet Take 1 tablet (1,000 mg total) by mouth daily as needed. 90 tablet 1   Current Facility-Administered Medications  Medication Dose Route Frequency Provider Last Rate Last Dose  . 0.9 %  sodium chloride infusion  500 mL Intravenous Continuous Irene Shipper, MD        Family History  Problem Relation Age of Onset  . Bipolar disorder Mother   . Hepatitis C Mother   . Colon polyps Father   . Atrial fibrillation Father   . Hypertension Father   . Hyperlipidemia Father   . Breast cancer Maternal Grandmother   . Leukemia Maternal Grandmother   . Diabetes Other     ROS:  Pertinent items are noted in HPI.  Otherwise, a comprehensive ROS was negative.  Exam:   There were no vitals taken for this visit.    General appearance: alert, cooperative and appears stated age Head: Normocephalic, without obvious abnormality, atraumatic Neck: no adenopathy, supple, symmetrical, trachea midline and thyroid normal to inspection and palpation Lungs: clear to auscultation bilaterally Breasts: normal appearance, no masses or tenderness, No  nipple retraction or dimpling, No nipple discharge or bleeding, No axillary or supraclavicular adenopathy Heart: regular rate and rhythm Abdomen: soft, non-tender; no masses, no organomegaly Extremities: extremities normal, atraumatic, no cyanosis or edema Skin: Skin color, texture, turgor normal. No rashes or lesions Lymph nodes: Cervical, supraclavicular, and axillary nodes normal. No abnormal inguinal nodes palpated Neurologic: Grossly normal  Pelvic: External genitalia:  no lesions              Urethra:  normal appearing urethra with no masses, tenderness or lesions              Bartholins and Skenes: normal                 Vagina: normal appearing vagina with normal color and discharge, no lesions              Cervix: no lesions              Pap taken:  {yes no:314532} Bimanual Exam:  Uterus:  normal size, contour, position, consistency, mobility, non-tender              Adnexa: no mass, fullness, tenderness              Rectal exam: {yes no:314532}.  Confirms.              Anus:  normal sphincter tone, no lesions  Chaperone was present for exam.  Assessment:   Well woman visit with normal exam.   Plan: Mammogram screening discussed. Recommended self breast awareness. Pap and HR HPV as above. Guidelines for Calcium, Vitamin D, regular exercise program including cardiovascular and weight bearing exercise.   Follow up annually and prn.   Additional counseling given.  {yes [id number]. _______ minutes face to face time of which over 50% was spent in counseling.    After visit summary provided.

## 2016-12-12 NOTE — Progress Notes (Signed)
41 y.o. [id number] Single African American female here for annual exam.    Occasionally had menses one week early or one week late. Very short cycles but are heavy for the first day.   Still with migraines prior to, during, or one week after menses. Saw neurologist.  Increased Imitrex dosage.  Had a yeast infection prior to her menses.  Had itching but not discharge.  Uses probiotics and this does not seen to be helping.  In fact, she notices increased infections when she uses probiotics. Used Monistat 3 and this resolved her symptoms.   HgbA1C 5.7 one year ago.   Saw cardiologist for palpitations - PACs and PVCs.  Now treated with Metroprolol.  Nursing.   Labs next month with PCP.   PCP:  Dorothyann Peng, NP - [facility]  Patient's last menstrual period was 12/04/2016.           Sexually active: Yes.   Female partner.  Withdrawal method. The current method of family planning is none.    Exercising: Yes.    Exercise is limited by cardiac condition(s): heart palpitations. Smoker:  no  Health Maintenance: Pap:  10/02/14, Negative with neg HR HPV History of abnormal Pap:  Yes, 2000.  Colposcopy and no biopsy MMG:  05/12/16 BIRADS 1 negative/density b Colonoscopy:  05/11/16 Polyps - normal f/u 5-10 years BMD:   n/a  Result  n/a TDaP:  03/27/2008 Gardasil:   no HIV and Hep C: 10/02/14 Negative Screening Labs:  PCP takes care of labs   reports that she has never smoked. She has never used smokeless tobacco. She reports that she drinks alcohol. She reports that she does not use drugs.  Past Medical History:  Diagnosis Date  . Acne   . Allergy   . Anxiety   . Arthritis   . Constipation   . Herpes simplex    H/O HSV 1   and on leg  . Hx of migraines    no aura  . Hyperemesis   . STD (sexually transmitted disease)    HSV 1    Past Surgical History:  Procedure Laterality Date  . BREAST BIOPSY Right    benign core biopsy  . BUNIONECTOMY Bilateral    1996 and 1998   . excision of right breast mass Right    benign  . UMBILICAL HERNIA REPAIR      Current Outpatient Prescriptions  Medication Sig Dispense Refill  . metoCLOPramide (REGLAN) 10 MG tablet Take 1 tablet (10 mg total) by mouth 4 (four) times daily. (Patient taking differently: Take 10 mg by mouth every 8 (eight) hours as needed for nausea or vomiting. ) 30 tablet 1  . metoprolol succinate (TOPROL-XL) 25 MG 24 hr tablet Take 1/2 (12.5mg ) by mouth daily. Increase to 1 tablet daily after 3 days if tolerated 30 tablet 11  . SUMAtriptan (IMITREX) 100 MG tablet Take 1 tablet at earliest onset of migraine.  May repeat once in 2 hours if headache persists or recurs. (Patient taking differently: Take 100 mg by mouth as needed. Take 1 tablet at earliest onset of migraine.  May repeat once in 2 hours if headache persists or recurs.) 10 tablet 2  . valACYclovir (VALTREX) 1000 MG tablet Take 1 tablet (1,000 mg total) by mouth daily as needed. 90 tablet 1   Current Facility-Administered Medications  Medication Dose Route Frequency Provider Last Rate Last Dose  . 0.9 %  sodium chloride infusion  500 mL Intravenous Continuous Scarlette Shorts  N, MD        Family History  Problem Relation Age of Onset  . Bipolar disorder Mother   . Hepatitis C Mother   . Colon polyps Father   . Atrial fibrillation Father   . Hypertension Father   . Hyperlipidemia Father   . Breast cancer Maternal Grandmother   . Leukemia Maternal Grandmother   . Diabetes Other     ROS:  Pertinent items are noted in HPI.  Otherwise, a comprehensive ROS was negative.  Exam:   BP 118/76 (BP Location: Right Arm, Patient Position: Sitting, Cuff Size: Large)   Pulse 88   Resp 16   Ht 5' 8.5" (1.74 m)   Wt 210 lb (95.3 kg)   LMP 12/04/2016   BMI 31.47 kg/m     General appearance: alert, cooperative and appears stated age Head: Normocephalic, without obvious abnormality, atraumatic Neck: no adenopathy, supple, symmetrical, trachea midline  and thyroid normal to inspection and palpation Lungs: clear to auscultation bilaterally Breasts: normal appearance, no masses or tenderness, No nipple retraction or dimpling, No nipple discharge or bleeding, No axillary or supraclavicular adenopathy Heart: regular rate and rhythm Abdomen: soft, non-tender; no masses, no organomegaly Extremities: extremities normal, atraumatic, no cyanosis or edema Skin: Skin color, texture, turgor normal. No rashes or lesions Lymph nodes: Cervical, supraclavicular, and axillary nodes normal. No abnormal inguinal nodes palpated Neurologic: Grossly normal  Pelvic: External genitalia:  no lesions              Urethra:  normal appearing urethra with no masses, tenderness or lesions              Bartholins and Skenes: normal                 Vagina: normal appearing vagina with normal color and discharge, no lesions              Cervix: no lesions              Pap taken: No. Bimanual Exam:  Uterus:  normal size, contour, position, consistency, mobility, non-tender              Adnexa: no mass, fullness, tenderness              Rectal exam: Yes.  .  Confirms.              Anus:  normal sphincter tone, no lesions  Chaperone was present for exam.  Assessment:   Well woman visit with normal exam. Hx HSV I.   Valtrex from PCP.  Migraine headaches.  Vaginitis.  Hx elevated Hgb A1C.  Plan: Mammogram screening discussed. Recommended self breast awareness. Pap and HR HPV as above. Guidelines for Calcium, Vitamin D, regular exercise program including cardiovascular and weight bearing exercise. Affirm.  Treatment to follow prn.  I discussed her hx of elevated blood sugar as a risk factor for yeast infections. She will do labs with PCP and ask for a recheck on this.  Follow up annually and prn.    After visit summary provided.

## 2016-12-12 NOTE — Patient Instructions (Signed)
Intrauterine Device Information An intrauterine device (IUD) is inserted into your uterus to prevent pregnancy. There are two types of IUDs available:  Copper IUD-This type of IUD is wrapped in copper wire and is placed inside the uterus. Copper makes the uterus and fallopian tubes produce a fluid that kills sperm. The copper IUD can stay in place for 10 years.  Hormone IUD-This type of IUD contains the hormone progestin (synthetic progesterone). The hormone thickens the cervical mucus and prevents sperm from entering the uterus. It also thins the uterine lining to prevent implantation of a fertilized egg. The hormone can weaken or kill the sperm that get into the uterus. One type of hormone IUD can stay in place for 5 years, and another type can stay in place for 3 years.  Your health care provider will make sure you are a good candidate for a contraceptive IUD. Discuss with your health care provider the possible side effects. Advantages of an intrauterine device  IUDs are highly effective, reversible, long acting, and low maintenance.  There are no estrogen-related side effects.  An IUD can be used when breastfeeding.  IUDs are not associated with weight gain.  The copper IUD works immediately after insertion.  The hormone IUD works right away if inserted within 7 days of your period starting. You will need to use a backup method of birth control for 7 days if the hormone IUD is inserted at any other time in your cycle.  The copper IUD does not interfere with your female hormones.  The hormone IUD can make heavy menstrual periods lighter and decrease cramping.  The hormone IUD can be used for 3 or 5 years.  The copper IUD can be used for 10 years. Disadvantages of an intrauterine device  The hormone IUD can be associated with irregular bleeding patterns.  The copper IUD can make your menstrual flow heavier and more painful.  You may experience cramping and vaginal bleeding after  insertion. This information is not intended to replace advice given to you by your health care provider. Make sure you discuss any questions you have with your health care provider. Document Released: 02/15/2004 Document Revised: 08/19/2015 Document Reviewed: 09/01/2012 Elsevier Interactive Patient Education  2017 [place] AND DIET:  We recommended that you start or continue a regular exercise program for good health. Regular exercise means any activity that makes your heart beat faster and makes you sweat.  We recommend exercising at least 30 minutes per day at least 3 days a week, preferably 4 or 5.  We also recommend a diet low in fat and sugar.  Inactivity, poor dietary choices and obesity can cause diabetes, heart attack, stroke, and kidney damage, among others.    ALCOHOL AND SMOKING:  Women should limit their alcohol intake to no more than 7 drinks/beers/glasses of wine (combined, not each!) per week. Moderation of alcohol intake to this level decreases your risk of breast cancer and liver damage. And of course, no recreational drugs are part of a healthy lifestyle.  And absolutely no smoking or even second hand smoke. Most people know smoking can cause heart and lung diseases, but did you know it also contributes to weakening of your bones? Aging of your skin?  Yellowing of your teeth and nails?  CALCIUM AND VITAMIN D:  Adequate intake of calcium and Vitamin D are recommended.  The recommendations for exact amounts of these supplements seem to change often, but generally speaking 600 mg of calcium (either  carbonate or citrate) and 800 units of Vitamin D per day seems prudent. Certain women may benefit from higher intake of Vitamin D.  If you are among these women, your doctor will have told you during your visit.    PAP SMEARS:  Pap smears, to check for cervical cancer or precancers,  have traditionally been done yearly, although recent scientific advances have shown that most  women can have pap smears less often.  However, every woman still should have a physical exam from her gynecologist every year. It will include a breast check, inspection of the vulva and vagina to check for abnormal growths or skin changes, a visual exam of the cervix, and then an exam to evaluate the size and shape of the uterus and ovaries.  And after 43 years of age, a rectal exam is indicated to check for rectal cancers. We will also provide age appropriate advice regarding health maintenance, like when you should have certain vaccines, screening for sexually transmitted diseases, bone density testing, colonoscopy, mammograms, etc.   MAMMOGRAMS:  All women over 61 years old should have a yearly mammogram. Many facilities now offer a "3D" mammogram, which may cost around $50 extra out of pocket. If possible,  we recommend you accept the option to have the 3D mammogram performed.  It both reduces the number of women who will be called back for extra views which then turn out to be normal, and it is better than the routine mammogram at detecting truly abnormal areas.    COLONOSCOPY:  Colonoscopy to screen for colon cancer is recommended for all women at age 64.  We know, you hate the idea of the prep.  We agree, BUT, having colon cancer and not knowing it is worse!!  Colon cancer so often starts as a polyp that can be seen and removed at colonscopy, which can quite literally save your life!  And if your first colonoscopy is normal and you have no family history of colon cancer, most women don't have to have it again for 10 years.  Once every ten years, you can do something that may end up saving your life, right?  We will be happy to help you get it scheduled when you are ready.  Be sure to check your insurance coverage so you understand how much it will cost.  It may be covered as a preventative service at no cost, but you should check your particular policy.

## 2016-12-13 LAB — VAGINITIS/VAGINOSIS, DNA PROBE
Candida Species: NEGATIVE
Gardnerella vaginalis: NEGATIVE
Trichomonas vaginosis: NEGATIVE

## 2016-12-13 MED FILL — SUMATRIPTAN SUCC 100 MG TAB: 100 | 30 days supply | Qty: 9 | Fill #2

## 2016-12-13 MED FILL — valACYclovir HCL 1 GM TABS: 1 | 30 days supply | Qty: 30 | Fill #1

## 2016-12-13 MED FILL — METOCLOPRAMIDE 10 MG TABLET: 10 | 7 days supply | Qty: 30 | Fill #1

## 2016-12-15 ENCOUNTER — Encounter: Payer: Self-pay | Admitting: Adult Health

## 2016-12-19 ENCOUNTER — Encounter: Payer: 59 | Admitting: Adult Health

## 2016-12-26 ENCOUNTER — Encounter: Payer: Self-pay | Admitting: Adult Health

## 2016-12-26 ENCOUNTER — Encounter: Payer: 59 | Admitting: Adult Health

## 2016-12-26 ENCOUNTER — Ambulatory Visit (INDEPENDENT_AMBULATORY_CARE_PROVIDER_SITE_OTHER): Payer: 59 | Admitting: Adult Health

## 2016-12-26 VITALS — BP 104/70 | Temp 98.5°F | Ht 69.0 in | Wt 208.0 lb

## 2016-12-26 DIAGNOSIS — G43801 Other migraine, not intractable, with status migrainosus: Secondary | ICD-10-CM | POA: Diagnosis not present

## 2016-12-26 DIAGNOSIS — R002 Palpitations: Secondary | ICD-10-CM | POA: Diagnosis not present

## 2016-12-26 DIAGNOSIS — Z23 Encounter for immunization: Secondary | ICD-10-CM | POA: Diagnosis not present

## 2016-12-26 DIAGNOSIS — Z0000 Encounter for general adult medical examination without abnormal findings: Secondary | ICD-10-CM

## 2016-12-26 LAB — CBC WITH DIFFERENTIAL/PLATELET
BASOS PCT: 0.5 % (ref 0.0–3.0)
Basophils Absolute: 0 10*3/uL (ref 0.0–0.1)
EOS ABS: 0.1 10*3/uL (ref 0.0–0.7)
EOS PCT: 0.7 % (ref 0.0–5.0)
HCT: 41 % (ref 36.0–46.0)
HEMOGLOBIN: 13.4 g/dL (ref 12.0–15.0)
LYMPHS PCT: 22.3 % (ref 12.0–46.0)
Lymphs Abs: 1.9 10*3/uL (ref 0.7–4.0)
MCHC: 32.8 g/dL (ref 30.0–36.0)
MCV: 89.5 fl (ref 78.0–100.0)
Monocytes Absolute: 0.8 10*3/uL (ref 0.1–1.0)
Monocytes Relative: 9.4 % (ref 3.0–12.0)
NEUTROS ABS: 5.7 10*3/uL (ref 1.4–7.7)
Neutrophils Relative %: 67.1 % (ref 43.0–77.0)
Platelets: 240 10*3/uL (ref 150.0–400.0)
RBC: 4.58 Mil/uL (ref 3.87–5.11)
RDW: 14.6 % (ref 11.5–15.5)
WBC: 8.4 10*3/uL (ref 4.0–10.5)

## 2016-12-26 LAB — HEPATIC FUNCTION PANEL
ALK PHOS: 44 U/L (ref 39–117)
ALT: 7 U/L (ref 0–35)
AST: 11 U/L (ref 0–37)
Albumin: 4.5 g/dL (ref 3.5–5.2)
BILIRUBIN TOTAL: 0.7 mg/dL (ref 0.2–1.2)
Bilirubin, Direct: 0.1 mg/dL (ref 0.0–0.3)
Total Protein: 7.5 g/dL (ref 6.0–8.3)

## 2016-12-26 LAB — BASIC METABOLIC PANEL
BUN: 11 mg/dL (ref 6–23)
CHLORIDE: 106 meq/L (ref 96–112)
CO2: 26 meq/L (ref 19–32)
CREATININE: 0.89 mg/dL (ref 0.40–1.20)
Calcium: 9.4 mg/dL (ref 8.4–10.5)
GFR: 88.94 mL/min (ref >60.00)
Glucose, Bld: 90 mg/dL (ref 70–99)
POTASSIUM: 4.2 meq/L (ref 3.5–5.1)
Sodium: 141 mEq/L (ref 135–145)

## 2016-12-26 LAB — LIPID PANEL
CHOL/HDL RATIO: 4
Cholesterol: 162 mg/dL (ref 0–200)
HDL: 45.1 mg/dL (ref >39.00)
LDL CALC: 102 mg/dL — AB (ref 0–99)
NONHDL: 117.27
Triglycerides: 77 mg/dL (ref 0.0–149.0)
VLDL: 15.4 mg/dL (ref 0.0–40.0)

## 2016-12-26 LAB — HEMOGLOBIN A1C: HEMOGLOBIN A1C: 5.8 % (ref 4.6–6.5)

## 2016-12-26 MED ORDER — KETOROLAC TROMETHAMINE 60 MG/2ML IM SOLN
60.0000 mg | Freq: Once | INTRAMUSCULAR | Status: AC
  Administered 2016-12-26: 60 mg via INTRAMUSCULAR

## 2016-12-26 NOTE — Patient Instructions (Signed)
It was great seeing you today!  Please follow up with me about your migraines if they do not become less frequent since being placed on a beta blocker   I will follow up with you regarding your lab work   Continue to work on weight loss through diet and exercise

## 2016-12-26 NOTE — Progress Notes (Signed)
Subjective:    Patient ID: Jennifer Cardenas, female    DOB: 03-Aug-1975, 41 y.o.   MRN: [id number]  HPI  Patient presents for yearly preventative medicine examination.She is a pleasant 41 year old female who  has a past medical history of Acne; Allergy; Anxiety; Arthritis; Constipation; Herpes simplex; migraines; Hyperemesis; and STD (sexually transmitted disease).  She takes Toprol 12.5 mg  mg for palpitations. She recently had an echo, holter monitor, and exercise stress test done that was essentially normal. She is seen by Dr. Gwenlyn Found. She reports in the office today that she continues to have palpitations but they are less frequent and do not last as long as they did before going on Toprol.   She takes Imitrex when needed for migraines. She is followed by Dr. Tomi Likens.   All immunizations and health maintenance protocols were reviewed with the patient and needed orders were placed.  Appropriate screening laboratory values were ordered for the patient including screening of hyperlipidemia, renal function and hepatic function.  Medication reconciliation,  past medical history, social history, problem list and allergies were reviewed in detail with the patient  Goals were established with regard to weight loss, exercise, and  diet in compliance with medications. She is working on diet and exercise.   Wt Readings from Last 3 Encounters:  12/26/16 208 lb (94.3 kg)  12/12/16 210 lb (95.3 kg)  12/11/16 210 lb (95.3 kg)    She is up to date on pap and mammogram   Her only acute complaint is that of currently having a Migraine. It feels as though it is her typical migraine. Denies any blurred vision.   Review of Systems  Constitutional: Negative.   HENT: Negative.   Eyes: Negative.   Respiratory: Negative.   Cardiovascular: Negative.   Gastrointestinal: Negative.   Endocrine: Negative.   Genitourinary: Negative.   Musculoskeletal: Negative.   Skin: Negative.   Allergic/Immunologic:  Negative.   Neurological: Positive for headaches.  Hematological: Negative.   Psychiatric/Behavioral: Negative.   All other systems reviewed and are negative.  Past Medical History:  Diagnosis Date  . Acne   . Allergy   . Anxiety   . Arthritis   . Constipation   . Herpes simplex    H/O HSV 1   and on leg  . Hx of migraines    no aura  . Hyperemesis   . STD (sexually transmitted disease)    HSV 1    Social History   Social History  . Marital status: Single    Spouse name: N/A  . Number of children: 2  . Years of education: N/A   Occupational History  . RN    Social History Main Topics  . Smoking status: Never Smoker  . Smokeless tobacco: Never Used  . Alcohol use 0.0 oz/week     Comment: Occasionally   . Drug use: No  . Sexual activity: Yes    Partners: Male   Other Topics Concern  . 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 ( 69 y/o)           Past Surgical History:  Procedure Laterality Date  . BREAST BIOPSY Right    benign core biopsy  . BUNIONECTOMY Bilateral    1996 and 1998  . excision of right breast mass Right    benign  . UMBILICAL HERNIA REPAIR  Family History  Problem Relation Age of Onset  . Bipolar disorder Mother   . Hepatitis C Mother   . Colon polyps Father   . Atrial fibrillation Father   . Hypertension Father   . Hyperlipidemia Father   . Breast cancer Maternal Grandmother   . Leukemia Maternal Grandmother   . Diabetes Other     Allergies  Allergen Reactions  . Benzoyl Peroxide Swelling    For acne     Current Outpatient Prescriptions on File Prior to Visit  Medication Sig Dispense Refill  . metoCLOPramide (REGLAN) 10 MG tablet Take 1 tablet (10 mg total) by mouth 4 (four) times daily. (Patient taking differently: Take 10 mg by mouth every 8 (eight) hours as needed for nausea or vomiting. ) 30 tablet 1  . metoprolol succinate (TOPROL-XL) 25 MG 24 hr tablet  Take 1/2 (12.5mg ) by mouth daily. Increase to 1 tablet daily after 3 days if tolerated 30 tablet 11  . SUMAtriptan (IMITREX) 100 MG tablet Take 1 tablet at earliest onset of migraine.  May repeat once in 2 hours if headache persists or recurs. (Patient taking differently: Take 100 mg by mouth as needed. Take 1 tablet at earliest onset of migraine.  May repeat once in 2 hours if headache persists or recurs.) 10 tablet 2  . valACYclovir (VALTREX) 1000 MG tablet Take 1 tablet (1,000 mg total) by mouth daily as needed. 90 tablet 1   No current facility-administered medications on file prior to visit.     BP 104/70 (BP Location: Right Arm)   Temp 98.5 F (36.9 C) (Oral)   Ht 5\' 9"  (1.753 m)   Wt 208 lb (94.3 kg)   LMP 12/04/2016   BMI 30.72 kg/m       Objective:   Physical Exam  Constitutional: She is oriented to person, place, and time. She appears well-developed and well-nourished. No distress.  HENT:  Head: Normocephalic and atraumatic.  Right Ear: External ear normal.  Left Ear: External ear normal.  Nose: Nose normal.  Mouth/Throat: Oropharynx is clear and moist. No oropharyngeal exudate.  Eyes: Pupils are equal, round, and reactive to light. Conjunctivae and EOM are normal. Right eye exhibits no discharge. Left eye exhibits no discharge. No scleral icterus.  Neck: Normal range of motion. Neck supple. No JVD present. No tracheal deviation present. No thyromegaly present.  Cardiovascular: Normal rate, regular rhythm, normal heart sounds and intact distal pulses.  Exam reveals no gallop and no friction rub.   No murmur heard. Pulmonary/Chest: Effort normal and breath sounds normal. No stridor. No respiratory distress. She has no wheezes. She has no rales. She exhibits no tenderness.  Abdominal: Soft. Bowel sounds are normal. She exhibits no distension and no mass. There is no tenderness. There is no rebound and no guarding.  Genitourinary:  Genitourinary Comments: Done by GYN     Musculoskeletal: Normal range of motion. She exhibits no edema, tenderness or deformity.  Lymphadenopathy:    She has no cervical adenopathy.  Neurological: She is alert and oriented to person, place, and time. She has normal reflexes. She displays normal reflexes. No cranial nerve deficit. She exhibits normal muscle tone. Coordination normal.  Skin: Skin is warm and dry. No rash noted. She is not diaphoretic. No erythema. No pallor.  Psychiatric: She has a normal mood and affect. Her behavior is normal. Judgment and thought content normal.  Nursing note and vitals reviewed.     Assessment & Plan:  1. Routine general medical  examination at a health care facility - Work on weight loss through diet and exercise  - Follow up in one year or sooner if needed - Basic metabolic panel - CBC with Differential/Platelet - Hemoglobin A1c - Hepatic function panel - Lipid panel  2. Other migraine with status migrainosus, not intractable - Hopefully being on a beta blocker will help decrease the amount of migraines she has  - ketorolac (TORADOL) injection 60 mg; Inject 2 mLs (60 mg total) into the muscle once.  3. Palpitations - Continue with Toprol XL. She has only been on this medication for about 10 days.  - Follow up with Cardiology as needed  4. Need for influenza vaccination  - Flu Vaccine QUAD 6+ mos PF IM (Fluarix Quad PF)   Dorothyann Peng, NP

## 2017-01-03 ENCOUNTER — Telehealth: Payer: Self-pay | Admitting: Adult Health

## 2017-01-03 NOTE — Telephone Encounter (Signed)
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  Pt would like to have it mailed to the address on file.

## 2017-01-04 NOTE — Telephone Encounter (Signed)
Sent by [employer]

## 2017-01-09 NOTE — Telephone Encounter (Signed)
Pt is calling stating that she did not receive the information and would like to have a copy and will come by the office on Friday to pick it up b/c it is needed to be turned in by 01/15/17

## 2017-01-09 NOTE — Telephone Encounter (Signed)
Placed at the front desk

## 2017-01-17 MED FILL — METOPROLOL SUCC ER 25 MG TA: 25 | 30 days supply | Qty: 30 | Fill #1

## 2017-01-17 MED FILL — valACYclovir HCL 1 GM TABS: 1 | 30 days supply | Qty: 30 | Fill #2

## 2017-04-19 ENCOUNTER — Ambulatory Visit: Payer: 59 | Admitting: Adult Health

## 2017-04-19 ENCOUNTER — Encounter: Payer: Self-pay | Admitting: Adult Health

## 2017-04-19 VITALS — BP 120/70 | Temp 98.8°F | Wt 206.0 lb

## 2017-04-19 DIAGNOSIS — J069 Acute upper respiratory infection, unspecified: Secondary | ICD-10-CM

## 2017-04-19 MED ORDER — HYDROCODONE-HOMATROPINE 5-1.5 MG/5ML PO SYRP: 5.0000 mL | ORAL_SOLUTION | Freq: Three times a day (TID) | ORAL | 0 refills | Status: DC | PRN

## 2017-04-19 MED ORDER — PREDNISONE 10 MG PO TABS: ORAL_TABLET | ORAL | 0 refills | Status: DC

## 2017-04-19 MED FILL — HYDROCODONE-HOMATROPINE SYR: 5-1.5 | 8 days supply | Qty: 120 | Fill #0

## 2017-04-19 MED FILL — predniSONE 10 MG TABS: 10 | 9 days supply | Qty: 21 | Fill #0

## 2017-04-19 NOTE — Progress Notes (Signed)
Subjective:    Patient ID: SRISHTI STRNAD, female    DOB: Feb 22, 1976, 42 y.o.   MRN: [id number]  Cough  This is a new problem. The current episode started more than 1 month ago. The problem has been gradually improving. Episode frequency: at night. The cough is non-productive. Pertinent negatives include no chills, fever, headaches, myalgias, nasal congestion, postnasal drip, rhinorrhea, sore throat, shortness of breath, sweats, weight loss or wheezing. Nothing aggravates the symptoms. She has tried OTC cough suppressant for the symptoms. The treatment provided mild relief.     Review of Systems  Constitutional: Negative for appetite change, chills, fatigue, fever, unexpected weight change and weight loss.  HENT: Negative for postnasal drip, rhinorrhea, sinus pressure, sinus pain and sore throat.   Eyes: Negative.   Respiratory: Positive for cough. Negative for shortness of breath and wheezing.   Musculoskeletal: Negative for myalgias.  Neurological: Negative for headaches.   Past Medical History:  Diagnosis Date  . Acne   . Allergy   . Anxiety   . Arthritis   . Constipation   . Herpes simplex    H/O HSV 1   and on leg  . Hx of migraines    no aura  . Hyperemesis   . STD (sexually transmitted disease)    HSV 1    Social History   Socioeconomic History  . Marital status: Single    Spouse name: Not on file  . Number of children: 2  . Years of education: Not on file  . Highest education level: Not on file  Social Needs  . Financial resource strain: Not on file  . Food insecurity - worry: Not on file  . Food insecurity - inability: Not on file  . Transportation needs - medical: Not on file  . Transportation needs - non-medical: Not on file  Occupational History  . Occupation: Therapist, sports  Tobacco Use  . Smoking status: Never Smoker  . Smokeless tobacco: Never Used  Substance and Sexual Activity  . Alcohol use: Yes    Alcohol/week: 0.0 oz    Comment: Occasionally   . Drug  use: No  . Sexual activity: Yes    Partners: Male  Other Topics Concern  . Not on file  Social History Narrative   She is a Therapist, sports - travels - Med/Surg   Has a son at home ( 79 y/o)        Past Surgical History:  Procedure Laterality Date  . BREAST BIOPSY Right    benign core biopsy  . BUNIONECTOMY Bilateral    1996 and 1998  . excision of right breast mass Right    benign  . UMBILICAL HERNIA REPAIR      Family History  Problem Relation Age of Onset  . Bipolar disorder Mother   . Hepatitis C Mother   . Colon polyps Father   . Atrial fibrillation Father   . Hypertension Father   . Hyperlipidemia Father   . Breast cancer Maternal Grandmother   . Leukemia Maternal Grandmother   . Diabetes Other     Allergies  Allergen Reactions  . Benzoyl Peroxide Swelling    For acne     Current Outpatient Medications on File Prior to Visit  Medication Sig Dispense Refill  . metoCLOPramide (REGLAN) 10 MG tablet Take 1 tablet (10 mg total) by mouth 4 (four) times daily. (Patient taking differently: Take 10 mg by mouth every 8 (eight) hours as needed for nausea or vomiting. ) 30  tablet 1  . SUMAtriptan (IMITREX) 100 MG tablet Take 1 tablet at earliest onset of migraine.  May repeat once in 2 hours if headache persists or recurs. (Patient taking differently: Take 100 mg by mouth as needed. Take 1 tablet at earliest onset of migraine.  May repeat once in 2 hours if headache persists or recurs.) 10 tablet 2  . valACYclovir (VALTREX) 1000 MG tablet Take 1 tablet (1,000 mg total) by mouth daily as needed. 90 tablet 1  . metoprolol succinate (TOPROL-XL) 25 MG 24 hr tablet Take 1/2 (12.5mg ) by mouth daily. Increase to 1 tablet daily after 3 days if tolerated (Patient not taking: Reported on 04/19/2017) 30 tablet 11   No current facility-administered medications on file prior to visit.     BP 120/70 (BP Location: Right Arm)   Temp 98.8 F (37.1 C) (Oral)   Wt 206 lb (93.4 kg)   BMI 30.42 kg/m         Objective:   Physical Exam  Constitutional: She appears well-developed and well-nourished. No distress.  HENT:  Right Ear: Hearing, tympanic membrane, external ear and ear canal normal.  Left Ear: Hearing, tympanic membrane, external ear and ear canal normal.  Nose: Right sinus exhibits maxillary sinus tenderness. Right sinus exhibits no frontal sinus tenderness. Left sinus exhibits no maxillary sinus tenderness and no frontal sinus tenderness.  Mouth/Throat: Uvula is midline. Posterior oropharyngeal erythema present.  Inferior turbinates are red and boggy without exudate  Eyes: Conjunctivae are normal.  Neck:  right  Cardiovascular: Normal rate, regular rhythm and normal heart sounds. Exam reveals no gallop and no friction rub.  No murmur heard. Pulmonary/Chest: Effort normal and breath sounds normal. No stridor.  Lymphadenopathy:    She has cervical adenopathy.  Skin: She is not diaphoretic.      Assessment & Plan:  1. Viral upper respiratory tract infection - Appears viral in nature. Will give prednisone taper and hycodan cough syrup to help resolve the cough   Dorothyann Peng, NP

## 2017-04-20 MED FILL — valACYclovir HCL 1 GM TABS: 1 | 30 days supply | Qty: 30 | Fill #3

## 2017-10-02 ENCOUNTER — Encounter: Payer: Self-pay | Admitting: Adult Health

## 2017-10-27 IMAGING — DX DG CHEST 2V
2 series · 2 of 2 positions shown · non-contrast
Comparison: None.

CLINICAL DATA: Chronic cough and congestion. Runny nose. Initial
encounter.

EXAM:
CHEST  2 VIEW

[chest pa]
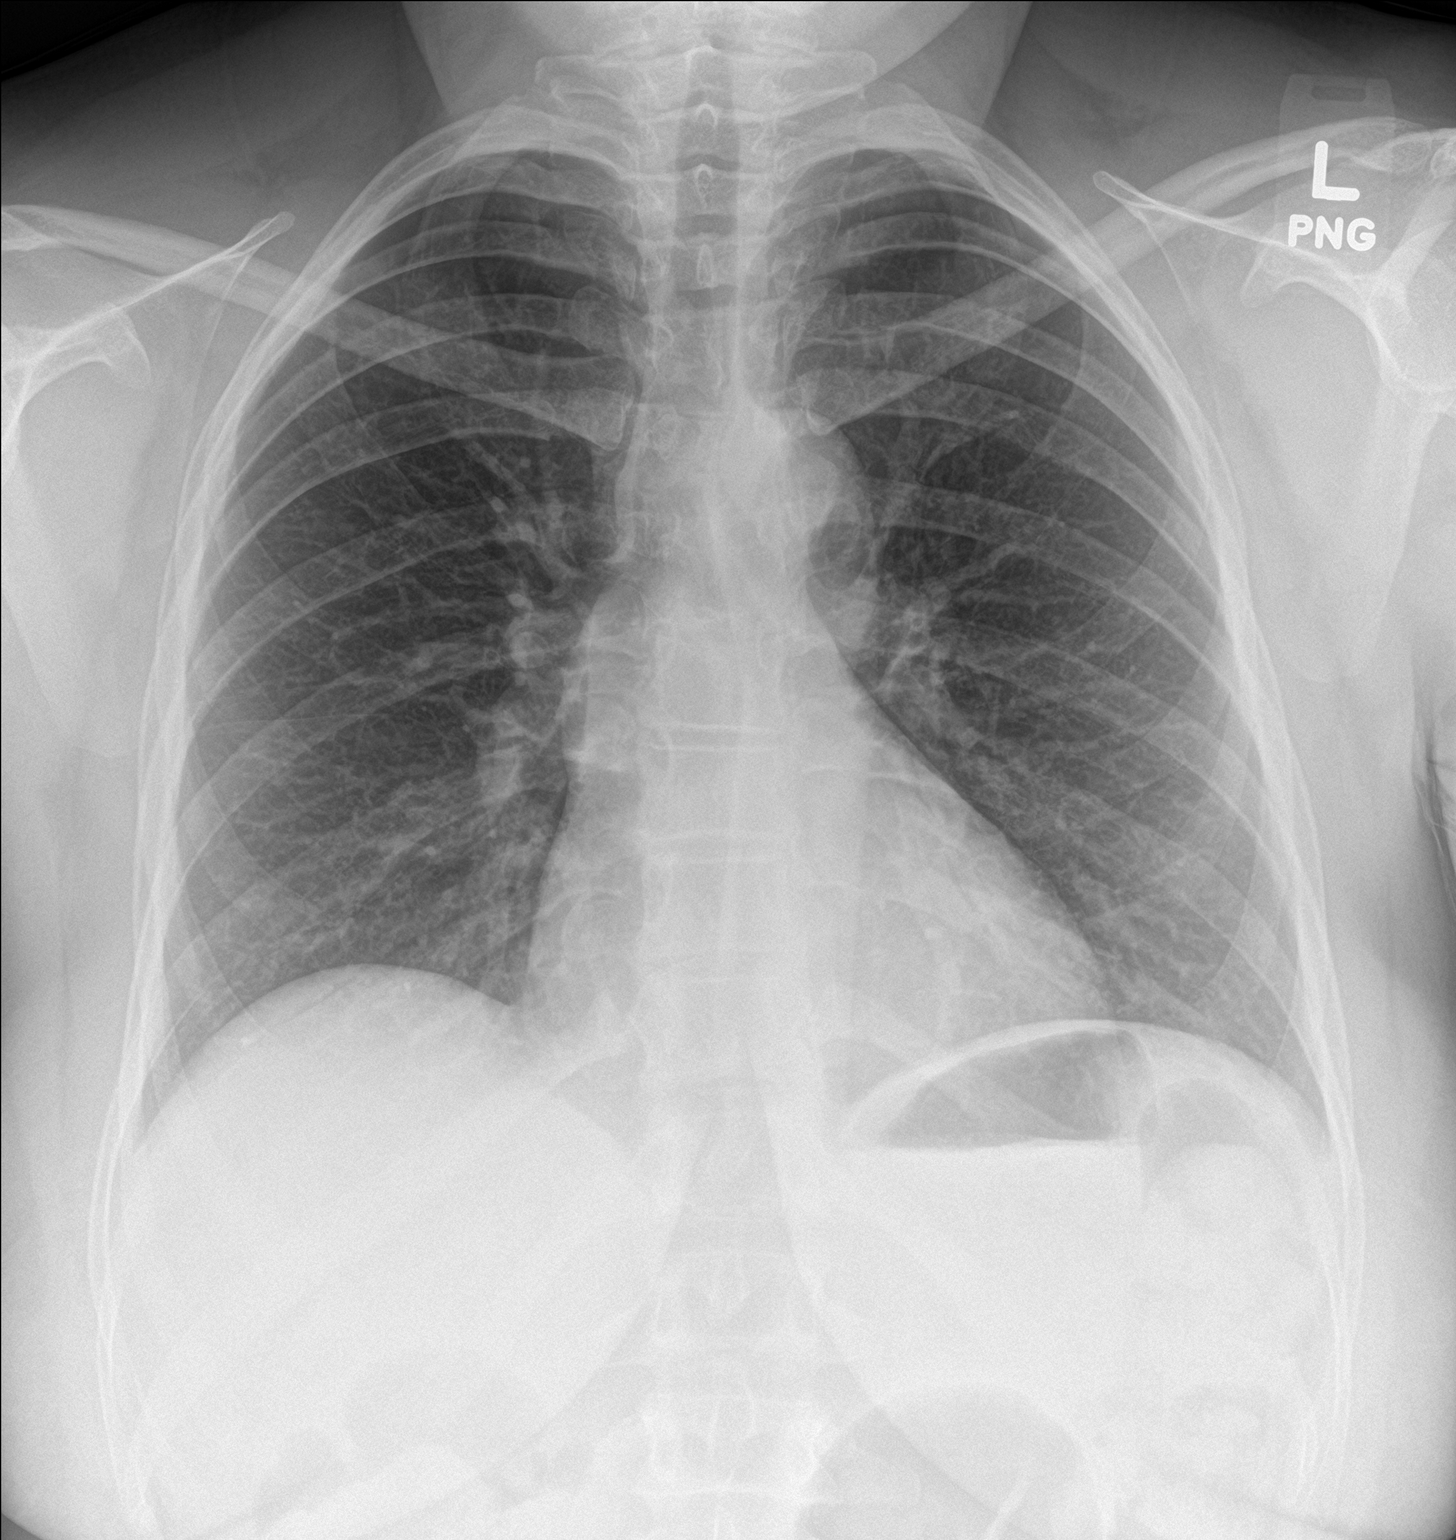

[chest lat]
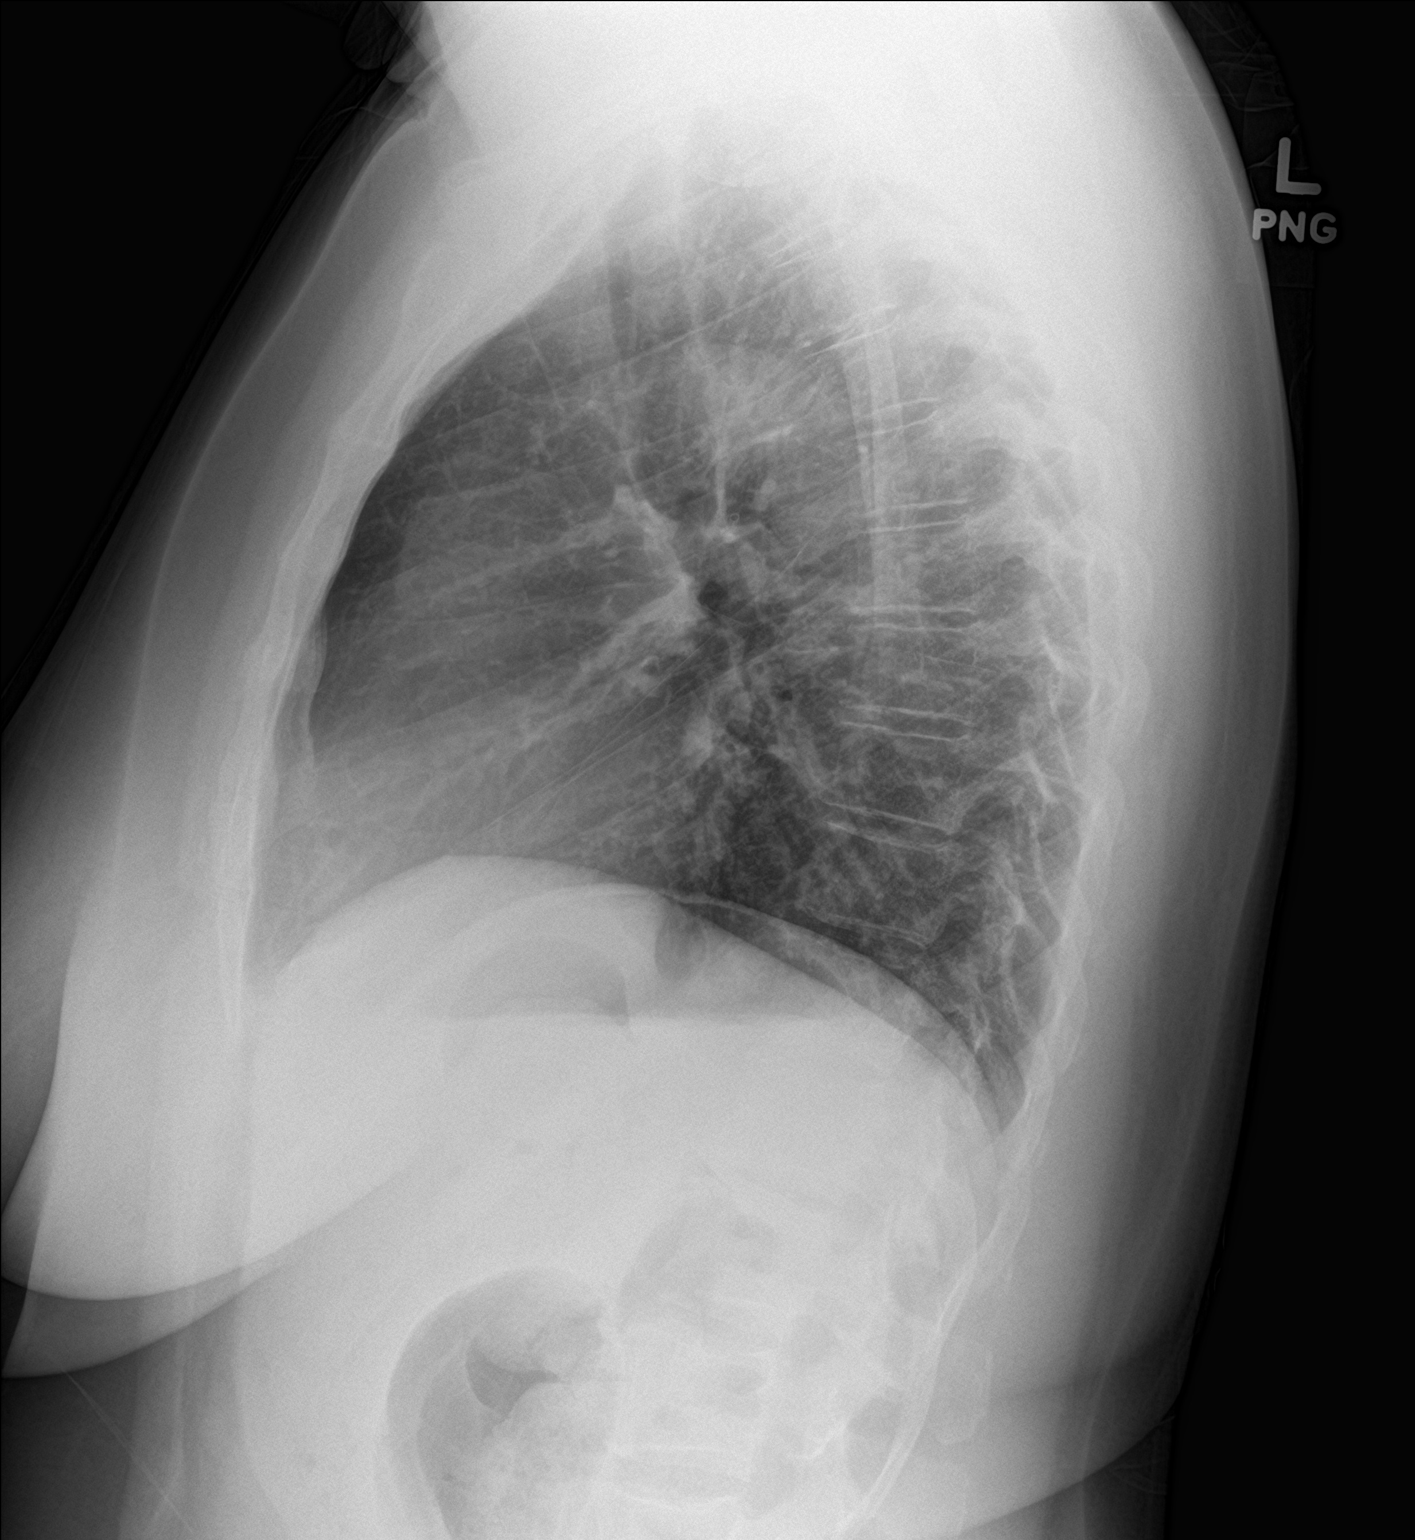

[2 of 2 positions shown; findings below may reference images not displayed]

FINDINGS: The lungs are well-aerated. Mild peribronchial thickening is noted.
There is no evidence of focal opacification, pleural effusion or
pneumothorax.

The heart is normal in size; the mediastinal contour is within
normal limits. No acute osseous abnormalities are seen.
IMPRESSION: Mild peribronchial thickening.  Lungs otherwise grossly clear.

## 2017-11-15 ENCOUNTER — Encounter: Payer: Self-pay | Admitting: Adult Health

## 2017-11-15 NOTE — Telephone Encounter (Signed)
I need a tdap, is it possible I come in today or Monday morning? Thank you.  [phone]   Last tdap was 03/2008.   Last ov--04/19/2017  [facility] please advise if ok to schedule tdap for the pt.  Thanks

## 2017-11-19 ENCOUNTER — Ambulatory Visit: Payer: 59

## 2017-11-19 ENCOUNTER — Ambulatory Visit (INDEPENDENT_AMBULATORY_CARE_PROVIDER_SITE_OTHER): Payer: Self-pay | Admitting: Family Medicine

## 2017-11-19 DIAGNOSIS — Z23 Encounter for immunization: Secondary | ICD-10-CM

## 2018-06-19 ENCOUNTER — Ambulatory Visit (INDEPENDENT_AMBULATORY_CARE_PROVIDER_SITE_OTHER): Payer: Self-pay | Admitting: Obstetrics and Gynecology

## 2018-06-19 ENCOUNTER — Telehealth: Payer: Self-pay | Admitting: Obstetrics and Gynecology

## 2018-06-19 ENCOUNTER — Encounter: Payer: Self-pay | Admitting: Obstetrics and Gynecology

## 2018-06-19 ENCOUNTER — Other Ambulatory Visit: Payer: Self-pay

## 2018-06-19 VITALS — BP 122/80 | HR 70 | Temp 98.2°F | Resp 16 | Wt 209.0 lb

## 2018-06-19 DIAGNOSIS — N761 Subacute and chronic vaginitis: Secondary | ICD-10-CM

## 2018-06-19 DIAGNOSIS — N898 Other specified noninflammatory disorders of vagina: Secondary | ICD-10-CM

## 2018-06-19 DIAGNOSIS — Z113 Encounter for screening for infections with a predominantly sexual mode of transmission: Secondary | ICD-10-CM

## 2018-06-19 MED ORDER — VALACYCLOVIR HCL 1 G PO TABS: ORAL_TABLET | ORAL | 1 refills | Status: AC

## 2018-06-19 NOTE — Telephone Encounter (Signed)
Patient called requesting an appointment for a possible BV or yeast infection.

## 2018-06-19 NOTE — Patient Instructions (Addendum)
You can use Vaseline externally every day. You can use over the counter hydrocortisone ointment for 1-2 weeks as needed. If your vagina feels dry you can can use coconut oil or a vaginal moisturizer (ie Replens)   Vaginitis  Vaginitis is irritation and swelling (inflammation) of the vagina. It happens when normal bacteria and yeast in the vagina grow too much. There are many types of this condition. Treatment will depend on the type you have. Follow these instructions at home: Lifestyle  Keep your vagina area clean and dry. ? Avoid using soap. ? Rinse the area with water.  Do not do the following until your doctor says it is okay: ? Wash and clean out the vagina (douche). ? Use tampons. ? Have sex.  Wipe from front to back after going to the bathroom.  Let air reach your vagina. ? Wear cotton underwear. ? Do not wear: ? Underwear while you sleep. ? Tight pants. ? Thong underwear. ? Underwear or nylons without a cotton panel. ? Take off any wet clothing, such as bathing suits, as soon as possible.  Use gentle, non-scented products. Do not use things that can irritate the vagina, such as fabric softeners. Avoid the following products if they are scented: ? Feminine sprays. ? Detergents. ? Tampons. ? Feminine hygiene products. ? Soaps or bubble baths.  Practice safe sex and use condoms. General instructions  Take over-the-counter and prescription medicines only as told by your doctor.  If you were prescribed an antibiotic medicine, take or use it as told by your doctor. Do not stop taking or using the antibiotic even if you start to feel better.  Keep all follow-up visits as told by your doctor. This is important. Contact a doctor if:  You have pain in your belly.  You have a fever.  Your symptoms last for more than 2-3 days. Get help right away if:  You have a fever and your symptoms get worse all of a sudden. Summary  Vaginitis is irritation and swelling of the  vagina. It can happen when the normal bacteria and yeast in the vagina grow too much. There are many types.  Treatment will depend on the type you have.  Do not douche, use tampons , or have sex until your health care provider approves. When you can return to sex, practice safe sex and use condoms. This information is not intended to replace advice given to you by your health care provider. Make sure you discuss any questions you have with your health care provider. Document Released: 06/09/2008 Document Revised: 04/04/2016 Document Reviewed: 04/04/2016 Elsevier Interactive Patient Education  2019 [employer].

## 2018-06-19 NOTE — Progress Notes (Signed)
GYNECOLOGY  VISIT   HPI: 43 y.o.   Single Black or African American Not Hispanic or Latino  female   [phone] with No LMP recorded.   here for vaginitis. She had BV in 12/19, she treated with BV. Symptoms improved. In January she had sex with a new partner, no condoms. She started with some vaginal discharge off and on for a couple of months. Occasional itching, no odor.  She had bleeding a couple of days after sex, not her period.  Had sex again with the same partner and again noted vaginal discharge.  She had some vaginal dryness, decrease in libido.  Cycles are coming monthly now, in 11/19 and 12/19 cycle q 3 weeks.    GYNECOLOGIC HISTORY: No LMP recorded. Contraception:condoms occ Menopausal hormone therapy: none        OB History    Gravida  2   Para  2   Term  2   Preterm  0   AB  0   Living  2     SAB  0   TAB  0   Ectopic  0   Multiple  0   Live Births  2              Patient Active Problem List   Diagnosis Date Noted  . Palpitations 10/03/2016  . Atypical chest pain 10/03/2016  . Patellofemoral arthritis of right knee 08/31/2015  . Synovitis of knee 08/31/2015  . Chronic constipation 04/29/2014  . Herpes simplex 12/26/2012  . Migraine headache 12/13/2012    Past Medical History:  Diagnosis Date  . Acne   . Allergy   . Anxiety   . Arthritis   . Constipation   . Herpes simplex    H/O HSV 1   and on leg  . Hx of migraines    no aura  . Hyperemesis   . STD (sexually transmitted disease)    HSV 1    Past Surgical History:  Procedure Laterality Date  . BREAST BIOPSY Right    benign core biopsy  . BUNIONECTOMY Bilateral    1996 and 1998  . excision of right breast mass Right    benign  . UMBILICAL HERNIA REPAIR      Current Outpatient Medications  Medication Sig Dispense Refill  . HYDROcodone-homatropine (HYCODAN) 5-1.5 MG/5ML syrup Take 5 mLs by mouth every 8 (eight) hours as needed for cough. 120 mL 0  . metoCLOPramide (REGLAN)  10 MG tablet Take 1 tablet (10 mg total) by mouth 4 (four) times daily. (Patient taking differently: Take 10 mg by mouth every 8 (eight) hours as needed for nausea or vomiting. ) 30 tablet 1  . metoprolol succinate (TOPROL-XL) 25 MG 24 hr tablet Take 1/2 (12.5mg ) by mouth daily. Increase to 1 tablet daily after 3 days if tolerated (Patient not taking: Reported on 04/19/2017) 30 tablet 11  . predniSONE (DELTASONE) 10 MG tablet 40 mg x 3 days, 20 mg x 3 days, 10 mg x 3 days 21 tablet 0  . SUMAtriptan (IMITREX) 100 MG tablet Take 1 tablet at earliest onset of migraine.  May repeat once in 2 hours if headache persists or recurs. (Patient taking differently: Take 100 mg by mouth as needed. Take 1 tablet at earliest onset of migraine.  May repeat once in 2 hours if headache persists or recurs.) 10 tablet 2  . valACYclovir (VALTREX) 1000 MG tablet Take 1 tablet (1,000 mg total) by mouth daily as needed. 90 tablet  1   No current facility-administered medications for this visit.      ALLERGIES: Benzoyl peroxide  Family History  Problem Relation Age of Onset  . Bipolar disorder Mother   . Hepatitis C Mother   . Colon polyps Father   . Atrial fibrillation Father   . Hypertension Father   . Hyperlipidemia Father   . Breast cancer Maternal Grandmother   . Leukemia Maternal Grandmother   . Diabetes Other     Social History   Socioeconomic History  . Marital status: Single    Spouse name: Not on file  . Number of children: 2  . Years of education: Not on file  . Highest education level: Not on file  Occupational History  . Occupation: Therapist, sports  Social Needs  . Financial resource strain: Not on file  . Food insecurity:    Worry: Not on file    Inability: Not on file  . Transportation needs:    Medical: Not on file    Non-medical: Not on file  Tobacco Use  . Smoking status: Never Smoker  . Smokeless tobacco: Never Used  Substance and Sexual Activity  . Alcohol use: Yes    Alcohol/week: 0.0  standard drinks    Comment: Occasionally   . Drug use: No  . Sexual activity: Yes    Partners: Male  Lifestyle  . Physical activity:    Days per week: Not on file    Minutes per session: Not on file  . Stress: Not on file  Relationships  . Social connections:    Talks on phone: Not on file    Gets together: Not on file    Attends religious service: Not on file    Active member of club or organization: Not on file    Attends meetings of clubs or organizations: Not on file    Relationship status: Not on file  . Intimate partner violence:    Fear of current or ex partner: Not on file    Emotionally abused: Not on file    Physically abused: Not on file    Forced sexual activity: Not on file  Other Topics Concern  . 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   HENT: Negative.   Eyes: Negative.   Respiratory: Negative.   Cardiovascular: Negative.   Gastrointestinal: Negative.   Genitourinary:       Vaginal itching & burning  Musculoskeletal: Negative.   Skin: Negative.   Neurological: Negative.   Endo/Heme/Allergies: Negative.   Psychiatric/Behavioral: Negative.     PHYSICAL EXAMINATION:    There were no vitals taken for this visit.    General appearance: alert, cooperative and appears stated age  Pelvic: External genitalia:  no lesions              Urethra:  normal appearing urethra with no masses, tenderness or lesions              Bartholins and Skenes: normal                 Vagina: normal appearing vagina with normal color and discharge, no lesions              Cervix: no cervical motion tenderness and no lesions  Bimanual Exam:  Uterus:  normal size, contour, position, consistency, mobility, non-tender and anteverted              Adnexa: no mass, fullness, tenderness               Chaperone was present for exam.  Wet prep: no clue,  no trich, + wbc KOH: no yeast PH: 4   ASSESSMENT Vaginitis STD testing Vaginal dryness H/O oral HSV, needs refill for prn use of valtrex    PLAN Vaginal slides without clear infection Affirm sent GC/CT, HIV/RPR (declines the blood work secondary to cost, will get it at the health department) Can use hydrocortisone ointment externally for 1-2 weeks Use lubricant with vaginal dryness Valtrex sent   An After Visit Summary was printed and given to the patient.  Over 15 minutes face to face time of which over 50% was spent in counseling.

## 2018-06-19 NOTE — Telephone Encounter (Signed)
Spoke with patient. Patient states that she is experiencing vaginal irration and burning. Patient has had unprotected intercourse and is concerned she may have BV or an STD. Denies vaginal discharge, pain, fever, or chills. Would like to be seen today for testing. Appointment scheduled for today at 2:30 pm with Dr.Jertson. Patient is agreeable to date and time. Encounter closed.

## 2018-06-20 ENCOUNTER — Encounter: Payer: Self-pay | Admitting: Obstetrics and Gynecology

## 2018-06-20 ENCOUNTER — Telehealth: Payer: Self-pay | Admitting: Obstetrics and Gynecology

## 2018-06-20 LAB — VAGINITIS/VAGINOSIS, DNA PROBE
Candida Species: NEGATIVE
Gardnerella vaginalis: NEGATIVE
TRICHOMONAS VAG: NEGATIVE

## 2018-06-20 LAB — GC/CHLAMYDIA PROBE AMP
CHLAMYDIA, DNA PROBE: NEGATIVE
Neisseria gonorrhoeae by PCR: NEGATIVE

## 2018-06-20 NOTE — Telephone Encounter (Signed)
Patient sent the following correspondence through [place]. Routing to triage to assist patient with request.  Message   Good morning,    If my results requires antibiotics could you please also give me a diflucan prescription? Thank you.

## 2018-07-09 MED FILL — valACYclovir HCL 1 GM TABS: 1 | 8 days supply | Qty: 30 | Fill #0

## 2018-08-14 ENCOUNTER — Encounter: Payer: Self-pay | Admitting: Obstetrics and Gynecology

## 2018-08-14 ENCOUNTER — Telehealth: Payer: Self-pay | Admitting: Obstetrics and Gynecology

## 2018-08-14 NOTE — Telephone Encounter (Signed)
Patient sent the following message through [place]. Routing to triage to assist patient with request.   GM, Ive tried several kinds of lubricants and each time I have a reaction. This last one has been the most irritating. Is it possible I can get a diflucan prescription sent to [place]? Thank you.

## 2018-08-14 NOTE — Telephone Encounter (Signed)
Left message to call Sharee Pimple, RN at [facility] [phone].    06/19/18 vaginitis testing negative

## 2018-08-20 NOTE — Telephone Encounter (Signed)
Left message to call Lyda Colcord, RN at [facility] [phone].   MyChart message to patient.     

## 2018-08-21 NOTE — Telephone Encounter (Signed)
Patient has not returned call.   MyChart message last read by Rande Lawman at 8:50 PM on 08/20/2018.    Routing to provider.  Encounter closed.

## 2019-02-09 ENCOUNTER — Encounter: Payer: Self-pay | Admitting: Adult Health

## 2019-02-11 ENCOUNTER — Telehealth (INDEPENDENT_AMBULATORY_CARE_PROVIDER_SITE_OTHER): Payer: Self-pay | Admitting: Adult Health

## 2019-02-11 ENCOUNTER — Encounter: Payer: Self-pay | Admitting: Adult Health

## 2019-02-11 ENCOUNTER — Other Ambulatory Visit: Payer: Self-pay

## 2019-02-11 DIAGNOSIS — Z20822 Contact with and (suspected) exposure to covid-19: Secondary | ICD-10-CM

## 2019-02-11 DIAGNOSIS — E860 Dehydration: Secondary | ICD-10-CM

## 2019-02-11 DIAGNOSIS — Z20828 Contact with and (suspected) exposure to other viral communicable diseases: Secondary | ICD-10-CM

## 2019-02-11 NOTE — Progress Notes (Signed)
Virtual Visit via Video Note  I connected with Jennifer Cardenas on 02/11/19 at  1:00 PM EST by a video enabled telemedicine application and verified that I am speaking with the correct person using two identifiers.  Location patient: home Location provider:work or home office Persons participating in the virtual visit: patient, provider  I discussed the limitations of evaluation and management by telemedicine and the availability of in person appointments. The patient expressed understanding and agreed to proceed.   HPI:  43 year old female who was at work at [employer] in [place].  She presented to the emergency room after presyncopal/syncopal event that happened while at work.  She began to feel dizzy while receiving report from a colleague.  She then went to the break room to sit down and had some diaphoresis, body tremors, and nausea without vomiting.  She originally thought that she did not have a syncopal episode but reports today that she believes she did, she does not remember much of the incident.  Her vitals are taken while in the break room and her BP was noted to be low at 80/50.  She had an IV bolus in the emergency room and her blood pressure improved.  Her labs are reassuring and it was thought that she was probably dehydrated.  On discharge her blood pressure was 116/76 with a  pulse is 76.  2 days before that event she had a sore throat, and the next day she felt fatigued and slept all day.  Day after the event she lost taste and appetite.  She continues to have loss of taste and appetite and is feeling slightly fatigued.  He had a Covid test done yesterday through employee health.   She has not had any syncope or presyncopal episodes since.   ROS: See pertinent positives and negatives per HPI.  Past Medical History:  Diagnosis Date  . Acne   . Allergy   . Anxiety   . Arthritis   . Constipation   . Herpes simplex    H/O HSV 1   and on leg  . Hx of migraines    no  aura  . Hyperemesis   . STD (sexually transmitted disease)    HSV 1    Past Surgical History:  Procedure Laterality Date  . BREAST BIOPSY Right    benign core biopsy  . BUNIONECTOMY Bilateral    1996 and 1998  . excision of right breast mass Right    benign  . UMBILICAL HERNIA REPAIR      Family History  Problem Relation Age of Onset  . Bipolar disorder Mother   . Hepatitis C Mother   . Colon polyps Father   . Atrial fibrillation Father   . Hypertension Father   . Hyperlipidemia Father   . Breast cancer Maternal Grandmother   . Leukemia Maternal Grandmother   . Diabetes Other      Current Outpatient Medications:  .  valACYclovir (VALTREX) 1000 MG tablet, Take 2 tablets BID for one day as needed, Disp: 30 tablet, Rfl: 1  EXAM:  VITALS per patient if applicable:  GENERAL: alert, oriented, appears well and in no acute distress  HEENT: atraumatic, conjunttiva clear, no obvious abnormalities on inspection of external nose and ears  NECK: normal movements of the head and neck  LUNGS: on inspection no signs of respiratory distress, breathing rate appears normal, no obvious gross SOB, gasping or wheezing  CV: no obvious cyanosis  MS: moves all visible extremities without noticeable  abnormality  PSYCH/NEURO: pleasant and cooperative, no obvious depression or anxiety, speech and thought processing grossly intact  ASSESSMENT AND PLAN:  Discussed the following assessment and plan:  1. Suspected COVID-19 virus infection -We will wait for Covid results to come back.  She was advised to follow-up with me via MyChart when she gets her results since they were done at employee health.  Self quarantine until results come back  2. Dehydration -Encouraged to force fluids.     I discussed the assessment and treatment plan with the patient. The patient was provided an opportunity to ask questions and all were answered. The patient agreed with the plan and demonstrated an  understanding of the instructions.   The patient was advised to call back or seek an in-person evaluation if the symptoms worsen or if the condition fails to improve as anticipated.   Dorothyann Peng, NP

## 2019-02-17 ENCOUNTER — Encounter: Payer: Self-pay | Admitting: Adult Health

## 2019-02-18 ENCOUNTER — Other Ambulatory Visit: Payer: Self-pay | Admitting: Adult Health

## 2019-02-18 MED ORDER — ALBUTEROL SULFATE HFA 108 (90 BASE) MCG/ACT IN AERS: 2.0000 | INHALATION_SPRAY | Freq: Four times a day (QID) | RESPIRATORY_TRACT | 1 refills | Status: DC | PRN

## 2019-02-26 ENCOUNTER — Encounter: Payer: Self-pay | Admitting: Adult Health

## 2019-02-26 ENCOUNTER — Other Ambulatory Visit: Payer: Self-pay | Admitting: Adult Health

## 2019-02-26 MED ORDER — SPACER/AERO-HOLDING CHAMBERS DEVI: 0 refills | Status: AC

## 2019-02-26 MED FILL — AEROCHAMBER: 20 days supply | Qty: 1 | Fill #0

## 2019-02-26 NOTE — Telephone Encounter (Signed)
Sent to the pharmacy by e-scribe. 

## 2019-05-26 NOTE — Progress Notes (Deleted)
44 y.o. [id number] Single Black or African American Not Hispanic or Latino female here for annual exam.      No LMP recorded.          Sexually active: {yes no:314532}  The current method of family planning is {contraception:315051}.    Exercising: {yes no:314532}  {types:19826} Smoker:  {YES [id number]  Health Maintenance: Pap:  10/02/14, Negative with neg HR HPV History of abnormal Pap:  Yes, 2000. Colposcopy and no biopsy MMG:  05/12/16 Density B Bi BMD:   *** Colonoscopy: 05/11/16 1 polyp repeat in 5-10 years  TDaP:  11/19/17 Gardasil:no   reports that she has never smoked. She has never used smokeless tobacco. She reports current alcohol use. She reports that she does not use drugs.  Past Medical History:  Diagnosis Date  . Acne   . Allergy   . Anxiety   . Arthritis   . Constipation   . Herpes simplex    H/O HSV 1   and on leg  . Hx of migraines    no aura  . Hyperemesis   . STD (sexually transmitted disease)    HSV 1    Past Surgical History:  Procedure Laterality Date  . BREAST BIOPSY Right    benign core biopsy  . BUNIONECTOMY Bilateral    1996 and 1998  . excision of right breast mass Right    benign  . UMBILICAL HERNIA REPAIR      Current Outpatient Medications  Medication Sig Dispense Refill  . albuterol (VENTOLIN HFA) 108 (90 Base) MCG/ACT inhaler Inhale 2 puffs into the lungs every 6 (six) hours as needed for wheezing or shortness of breath. 8 g 1  . Spacer/Aero-Holding Chambers DEVI Use with Ventolin to Inhale 2 puffs into the lungs every 6 hours PRN. 1 Device 0  . valACYclovir (VALTREX) 1000 MG tablet Take 2 tablets BID for one day as needed 30 tablet 1   No current facility-administered medications for this visit.    Family History  Problem Relation Age of Onset  . Bipolar disorder Mother   . Hepatitis C Mother   . Colon polyps Father   . Atrial fibrillation Father   . Hypertension Father   . Hyperlipidemia Father   . Breast cancer Maternal  Grandmother   . Leukemia Maternal Grandmother   . Diabetes Other     Review of Systems  Exam:   There were no vitals taken for this visit.  Weight change: @WEIGHTCHANGE @ Height:      Ht Readings from Last 3 Encounters:  12/26/16 5\' 9"  (1.753 m)  12/12/16 5' 8.5" (1.74 m)  12/11/16 5\' 8"  (1.727 m)    General appearance: alert, cooperative and appears stated age Head: Normocephalic, without obvious abnormality, atraumatic Neck: no adenopathy, supple, symmetrical, trachea midline and thyroid {CHL AMB PHY EX THYROID NORM [phone]::"normal to inspection and palpation"} Lungs: clear to auscultation bilaterally Cardiovascular: regular rate and rhythm Breasts: {Exam; breast:13139::"normal appearance, no masses or tenderness"} Abdomen: soft, non-tender; non distended,  no masses,  no organomegaly Extremities: extremities normal, atraumatic, no cyanosis or edema Skin: Skin color, texture, turgor normal. No rashes or lesions Lymph nodes: Cervical, supraclavicular, and axillary nodes normal. No abnormal inguinal nodes palpated Neurologic: Grossly normal   Pelvic: External genitalia:  no lesions              Urethra:  normal appearing urethra with no masses, tenderness or lesions  Bartholins and Skenes: normal                 Vagina: normal appearing vagina with normal color and discharge, no lesions              Cervix: {CHL AMB PHY EX CERVIX NORM DEFAULT:[phone]::"no lesions"}               Bimanual Exam:  Uterus:  {CHL AMB PHY EX UTERUS NORM DEFAULT:[phone]::"normal size, contour, position, consistency, mobility, non-tender"}              Adnexa: {CHL AMB PHY EX ADNEXA NO MASS [phone]::"no mass, fullness, tenderness"}               Rectovaginal: Confirms               Anus:  normal sphincter tone, no lesions  *** chaperoned for the exam.  A:  Well Woman with normal exam  P:

## 2019-05-27 ENCOUNTER — Ambulatory Visit: Payer: Self-pay | Admitting: Obstetrics and Gynecology

## 2019-05-27 ENCOUNTER — Other Ambulatory Visit: Payer: Self-pay

## 2019-06-17 ENCOUNTER — Ambulatory Visit: Payer: Self-pay | Admitting: Obstetrics and Gynecology

## 2020-10-25 HISTORY — PX: OTHER SURGICAL HISTORY: SHX169

## 2020-11-18 ENCOUNTER — Emergency Department (HOSPITAL_COMMUNITY): Payer: Self-pay

## 2020-11-18 ENCOUNTER — Encounter (HOSPITAL_COMMUNITY): Payer: Self-pay | Admitting: Emergency Medicine

## 2020-11-18 ENCOUNTER — Inpatient Hospital Stay (HOSPITAL_COMMUNITY)
Admission: EM | Admit: 2020-11-18 | Discharge: 2020-11-22 | DRG: 863 | Disposition: A | Discharge status: Home / Self Care | Payer: Self-pay | Attending: Internal Medicine | Admitting: Internal Medicine

## 2020-11-18 DIAGNOSIS — T8131XA Disruption of external operation (surgical) wound, not elsewhere classified, initial encounter: Secondary | ICD-10-CM | POA: Diagnosis present

## 2020-11-18 DIAGNOSIS — Z888 Allergy status to other drugs, medicaments and biological substances status: Secondary | ICD-10-CM

## 2020-11-18 DIAGNOSIS — R1011 Right upper quadrant pain: Secondary | ICD-10-CM

## 2020-11-18 DIAGNOSIS — L03311 Cellulitis of abdominal wall: Secondary | ICD-10-CM | POA: Diagnosis present

## 2020-11-18 DIAGNOSIS — Z79899 Other long term (current) drug therapy: Secondary | ICD-10-CM

## 2020-11-18 DIAGNOSIS — R0602 Shortness of breath: Secondary | ICD-10-CM

## 2020-11-18 DIAGNOSIS — L0390 Cellulitis, unspecified: Secondary | ICD-10-CM | POA: Diagnosis present

## 2020-11-18 DIAGNOSIS — E876 Hypokalemia: Secondary | ICD-10-CM | POA: Diagnosis present

## 2020-11-18 DIAGNOSIS — F419 Anxiety disorder, unspecified: Secondary | ICD-10-CM | POA: Diagnosis present

## 2020-11-18 DIAGNOSIS — M1990 Unspecified osteoarthritis, unspecified site: Secondary | ICD-10-CM | POA: Diagnosis present

## 2020-11-18 DIAGNOSIS — T8141XA Infection following a procedure, superficial incisional surgical site, initial encounter: Principal | ICD-10-CM | POA: Diagnosis present

## 2020-11-18 DIAGNOSIS — Z20822 Contact with and (suspected) exposure to covid-19: Secondary | ICD-10-CM | POA: Diagnosis present

## 2020-11-18 DIAGNOSIS — Y838 Other surgical procedures as the cause of abnormal reaction of the patient, or of later complication, without mention of misadventure at the time of the procedure: Secondary | ICD-10-CM | POA: Diagnosis present

## 2020-11-18 DIAGNOSIS — B009 Herpesviral infection, unspecified: Secondary | ICD-10-CM | POA: Diagnosis present

## 2020-11-18 DIAGNOSIS — R000 Tachycardia, unspecified: Secondary | ICD-10-CM | POA: Diagnosis present

## 2020-11-18 LAB — URINALYSIS, ROUTINE W REFLEX MICROSCOPIC
Bilirubin Urine: NEGATIVE
Glucose, UA: NEGATIVE mg/dL
Ketones, ur: NEGATIVE mg/dL
Leukocytes,Ua: NEGATIVE
Nitrite: NEGATIVE
Protein, ur: NEGATIVE mg/dL
Specific Gravity, Urine: 1.015 (ref 1.005–1.030)
pH: 6 (ref 5.0–8.0)

## 2020-11-18 LAB — COMPREHENSIVE METABOLIC PANEL
ALT: 11 U/L (ref 0–44)
AST: 18 U/L (ref 15–41)
Albumin: 3.5 g/dL (ref 3.5–5.0)
Alkaline Phosphatase: 45 U/L (ref 38–126)
Anion gap: 9 (ref 5–15)
BUN: 10 mg/dL (ref 6–20)
CO2: 27 mmol/L (ref 22–32)
Calcium: 9.4 mg/dL (ref 8.9–10.3)
Chloride: 101 mmol/L (ref 98–111)
Creatinine, Ser: 0.69 mg/dL (ref 0.44–1.00)
GFR, Estimated: >60 mL/min (ref >60)
Glucose, Bld: 89 mg/dL (ref 70–99)
Potassium: 3.3 mmol/L — ABNORMAL LOW (ref 3.5–5.1)
Sodium: 137 mmol/L (ref 135–145)
Total Bilirubin: 0.6 mg/dL (ref 0.3–1.2)
Total Protein: 7.8 g/dL (ref 6.5–8.1)

## 2020-11-18 LAB — URINALYSIS, MICROSCOPIC (REFLEX)
Bacteria, UA: NONE SEEN
WBC, UA: NONE SEEN WBC/hpf (ref 0–5)

## 2020-11-18 LAB — CBC WITH DIFFERENTIAL/PLATELET
Abs Immature Granulocytes: 0.03 10*3/uL (ref 0.00–0.07)
Basophils Absolute: 0 10*3/uL (ref 0.0–0.1)
Basophils Relative: 0 %
Eosinophils Absolute: 0.1 10*3/uL (ref 0.0–0.5)
Eosinophils Relative: 1 %
HCT: 34.9 % — ABNORMAL LOW (ref 36.0–46.0)
Hemoglobin: 11.7 g/dL — ABNORMAL LOW (ref 12.0–15.0)
Immature Granulocytes: 0 %
Lymphocytes Relative: 13 %
Lymphs Abs: 1.4 10*3/uL (ref 0.7–4.0)
MCH: 29.2 pg (ref 26.0–34.0)
MCHC: 33.5 g/dL (ref 30.0–36.0)
MCV: 87 fL (ref 80.0–100.0)
Monocytes Absolute: 1.5 10*3/uL — ABNORMAL HIGH (ref 0.1–1.0)
Monocytes Relative: 14 %
Neutro Abs: 7.5 10*3/uL (ref 1.7–7.7)
Neutrophils Relative %: 72 %
Platelets: 420 10*3/uL — ABNORMAL HIGH (ref 150–400)
RBC: 4.01 MIL/uL (ref 3.87–5.11)
RDW: 14 % (ref 11.5–15.5)
WBC: 10.6 10*3/uL — ABNORMAL HIGH (ref 4.0–10.5)
nRBC: 0 % (ref 0.0–0.2)

## 2020-11-18 LAB — D-DIMER, QUANTITATIVE: D-Dimer, Quant: 1.29 ug/mL-FEU — ABNORMAL HIGH (ref 0.00–0.50)

## 2020-11-18 LAB — LIPASE, BLOOD: Lipase: 28 U/L (ref 11–51)

## 2020-11-18 LAB — I-STAT BETA HCG BLOOD, ED (MC, WL, AP ONLY): I-stat hCG, quantitative: <5 m[IU]/mL (ref <5)

## 2020-11-18 MED ORDER — SODIUM CHLORIDE 0.9% FLUSH
3.0000 mL | Freq: Two times a day (BID) | INTRAVENOUS | Status: DC
  Administered 2020-11-19 – 2020-11-22 (×7): 3 mL via INTRAVENOUS

## 2020-11-18 MED ORDER — POTASSIUM CHLORIDE CRYS ER 20 MEQ PO TBCR
40.0000 meq | EXTENDED_RELEASE_TABLET | Freq: Once | ORAL | Status: AC
  Administered 2020-11-19: 40 meq via ORAL
  Filled 2020-11-18: qty 2

## 2020-11-18 MED ORDER — CEFTRIAXONE SODIUM 1 G IJ SOLR
1.0000 g | INTRAMUSCULAR | Status: DC
  Administered 2020-11-19 – 2020-11-22 (×4): 1 g via INTRAVENOUS
  Filled 2020-11-18 (×4): qty 10

## 2020-11-18 MED ORDER — LACTATED RINGERS IV SOLN: INTRAVENOUS | Status: AC

## 2020-11-18 MED ORDER — VANCOMYCIN HCL 10 G IV SOLR
2000.0000 mg | Freq: Once | INTRAVENOUS | Status: DC
  Filled 2020-11-18: qty 20

## 2020-11-18 MED ORDER — ACETAMINOPHEN 325 MG PO TABS: 650.0000 mg | ORAL_TABLET | Freq: Four times a day (QID) | ORAL | Status: DC | PRN

## 2020-11-18 MED ORDER — VANCOMYCIN HCL 2000 MG/400ML IV SOLN
2000.0000 mg | Freq: Once | INTRAVENOUS | Status: AC
  Administered 2020-11-19: 2000 mg via INTRAVENOUS
  Filled 2020-11-18: qty 400

## 2020-11-18 MED ORDER — ACETAMINOPHEN 650 MG RE SUPP: 650.0000 mg | Freq: Four times a day (QID) | RECTAL | Status: DC | PRN

## 2020-11-18 MED ORDER — PIPERACILLIN-TAZOBACTAM 3.375 G IVPB
3.3750 g | Freq: Once | INTRAVENOUS | Status: AC
  Administered 2020-11-19: 3.375 g via INTRAVENOUS
  Filled 2020-11-18: qty 50

## 2020-11-18 MED ORDER — ENOXAPARIN SODIUM 40 MG/0.4ML IJ SOSY
40.0000 mg | PREFILLED_SYRINGE | INTRAMUSCULAR | Status: DC
  Administered 2020-11-19 – 2020-11-22 (×4): 40 mg via SUBCUTANEOUS
  Filled 2020-11-18 (×3): qty 0.4

## 2020-11-18 MED ORDER — HYDROCODONE-ACETAMINOPHEN 5-325 MG PO TABS: 1.0000 | ORAL_TABLET | Freq: Four times a day (QID) | ORAL | Status: DC | PRN

## 2020-11-18 MED ORDER — IOHEXOL 350 MG/ML SOLN
100.0000 mL | Freq: Once | INTRAVENOUS | Status: AC | PRN
  Administered 2020-11-18: 100 mL via INTRAVENOUS

## 2020-11-18 MED ORDER — POLYETHYLENE GLYCOL 3350 17 G PO PACK: 17.0000 g | PACK | Freq: Every day | ORAL | Status: DC | PRN

## 2020-11-18 MED ORDER — LACTATED RINGERS IV BOLUS
1000.0000 mL | Freq: Once | INTRAVENOUS | Status: AC
  Administered 2020-11-18: 1000 mL via INTRAVENOUS

## 2020-11-18 NOTE — ED Provider Notes (Signed)
Emergency Medicine Provider Triage Evaluation Note  Jennifer Cardenas , a 45 y.o. female  was evaluated in triage.  Pt complains of tachycardic.  Review of Systems  Positive: Heart palpitation, SOB, abdominal discomfort, recent tummy tuck Negative: Fever, cp, cough, dysuria  Physical Exam  BP 134/81 (BP Location: Right Arm)   Pulse (!) 125   Temp 99.3 F (37.4 C) (Oral)   Resp 18   SpO2 100%  Gen:   Awake, no distress   Resp:  Normal effort  MSK:   Moves extremities without difficulty  Other:  Tachycardic, abdominal wound with poor healing, but nontender  Medical Decision Making  Medically screening exam initiated at 1:26 PM.  Appropriate orders placed.  Jennifer Cardenas was informed that the remainder of the evaluation will be completed by another provider, this initial triage assessment does not replace that evaluation, and the importance of remaining in the ED until their evaluation is complete.  Pt had tummy tuck 3 weeks ago, have been wearing abdominal binder.  Had sterile strip removed a few days ago and notice some fluid oozing from the surgical site.  For the past several days she felt off and notice heart racing with some SOB.     Domenic Moras, PA-C 11/18/20 1328    Sherwood Gambler, MD 11/18/20 [phone]

## 2020-11-18 NOTE — ED Notes (Signed)
Patient transported to CT 

## 2020-11-18 NOTE — ED Notes (Signed)
Patient transported to Ultrasound 

## 2020-11-18 NOTE — H&P (Signed)
History and Physical   Jennifer Cardenas [id number] DOB: 1975/06/06 DOA: 11/18/2020  PCP: Jennifer Peng, NP   Patient coming from: Home  Chief Complaint: Tachycardia, right upper quadrant pain  HPI: Jennifer Cardenas is a 45 y.o. female with medical history significant of constipation, migraine, arthritis, anxiety, HSV presenting with tachycardia and right upper quadrant pain.  Patient had a tummy tuck procedure performed 3 weeks ago in [place].  She has been following the surgeons advice on wound care.  She sent pictures of her wounds to her surgeons last week bast on some skin changes. She had just complete a course of post op antibiotics around that time.  She is concerned that the wound is now increasing in size.  She denies any drainage or significant pain at the infection site, but maybe some burning (though she also has numbness along the incision area).  She reports she has noticed some right upper quadrant pain after eating which is described as a bubbling sensation.  She also states that her apple watch has been alerting her in the last few days that her heart rate has been in the 120s to 130s at times into the 160s.  She states her baseline heart rate is in the 90s to 100s.   She also reports some dyspnea on exertion.  She denies fevers, chills, chest pain, constipation, diarrhea, nausea, vomiting.   ED Course: Vital signs in the ED significant for heart rate initially in the 120s now improved to the 110s after IV fluids.  Lab work-up showed CMP with potassium of 3.3.  CBC showed leukocytosis to 10.6 and hemoglobin of 11.7 down from 12.71-year ago however she did have surgery performed 3 weeks ago.  Platelets elevated at 420.  Lipase normal.  Lactic acid pending.  D-dimer elevated 1.29.  Urinalysis normal.  Blood cultures pending.  Quadrant ultrasound showed cholelithiasis without cholecystitis.  CTA PE study showed no PE did show mild right basilar atelectasis.  CT of the abdomen pelvis  showed cholelithiasis and mild retained fecal material.  Also demonstrated changes consistent with her history of tummy tuck and also subcutaneous edema and skin thickening likely representing cellulitis.  Patient started on vancomycin and Zosyn in the ED also received 1 L of IV fluids.  Review of Systems: As per HPI otherwise all other systems reviewed and are negative.  Past Medical History:  Diagnosis Date   Acne    Allergy    Anxiety    Arthritis    Constipation    Herpes simplex    H/O HSV 1   and on leg   Hx of migraines    no aura   Hyperemesis    STD (sexually transmitted disease)    HSV 1    Past Surgical History:  Procedure Laterality Date   BREAST BIOPSY Right    benign core biopsy   BUNIONECTOMY Bilateral    1996 and 1998   excision of right breast mass Right    benign   UMBILICAL HERNIA REPAIR      Social History  reports that she has never smoked. She has never used smokeless tobacco. She reports current alcohol use. She reports that she does not use drugs.  Allergies  Allergen Reactions   Benzoyl Peroxide Swelling    For acne     Family History  Problem Relation Age of Onset   Bipolar disorder Mother    Hepatitis C Mother    Colon polyps Father    Atrial  fibrillation Father    Hypertension Father    Hyperlipidemia Father    Breast cancer Maternal Grandmother    Leukemia Maternal Grandmother    Diabetes Other   Reviewed on admission  Prior to Admission medications   Medication Sig Start Date End Date Taking? Authorizing Provider  B Complex Vitamins (VITAMIN B COMPLEX PO) Take 1 tablet by mouth every evening.   Yes [provider]  Ferrous Sulfate (IRON PO) Take 1 tablet by mouth every evening.   Yes [provider]  Multiple Vitamins-Minerals (IMMUNE SUPPORT PO) Take 1 tablet by mouth every evening. Vitamin C, Zinc, Elderberry & Ginger   Yes [provider]  albuterol (VENTOLIN HFA) 108 (90 Base) MCG/ACT inhaler  Inhale 2 puffs into the lungs every 6 (six) hours as needed for wheezing or shortness of breath. Patient not taking: Reported on 11/18/2020 02/18/19   Jennifer Peng, NP  Spacer/Aero-Holding Dorise Bullion Use with Ventolin to Inhale 2 puffs into the lungs every 6 hours PRN. 02/26/19   Nafziger, Tommi Rumps, NP  valACYclovir (VALTREX) 1000 MG tablet Take 2 tablets BID for one day as needed Patient not taking: No sig reported 06/19/18   Salvadore Dom, MD    Physical Exam: Vitals:   11/18/20 2025 11/18/20 2130 11/18/20 2225 11/18/20 2332  BP: 139/80 121/77 134/70 119/88  Pulse: (!) 127 (!) 122 (!) 118 (!) 112  Resp: '18 16 16 16  '$ Temp:      TempSrc:      SpO2: 100% 100% 100% 100%   Physical Exam Constitutional:      General: She is not in acute distress.    Appearance: Normal appearance.  HENT:     Head: Normocephalic and atraumatic.     Mouth/Throat:     Mouth: Mucous membranes are moist.     Pharynx: Oropharynx is clear.  Eyes:     Extraocular Movements: Extraocular movements intact.     Pupils: Pupils are equal, round, and reactive to light.  Cardiovascular:     Rate and Rhythm: Regular rhythm. Tachycardia present.     Pulses: Normal pulses.     Heart sounds: Normal heart sounds.  Pulmonary:     Effort: Pulmonary effort is normal. No respiratory distress.     Breath sounds: Normal breath sounds.  Abdominal:     General: Bowel sounds are normal. There is no distension.     Palpations: Abdomen is soft.     Tenderness: There is no abdominal tenderness.     Comments: Incision site with anterior dehiscence, see photos for details.  Musculoskeletal:        General: No swelling or deformity.  Skin:    General: Skin is warm and dry.     Comments: Erythema around the dehisced portion of wound.  Neurological:     General: No focal deficit present.     Mental Status: Mental status is at baseline.         Labs on Admission: I have personally reviewed following labs and imaging  studies  CBC: Recent Labs  Lab 11/18/20 1345  WBC 10.6*  NEUTROABS 7.5  HGB 11.7*  HCT 34.9*  MCV 87.0  PLT 420*    Basic Metabolic Panel: Recent Labs  Lab 11/18/20 1345  NA 137  K 3.3*  CL 101  CO2 27  GLUCOSE 89  BUN 10  CREATININE 0.69  CALCIUM 9.4    GFR: CrCl cannot be calculated (Unknown ideal weight.).  Liver Function Tests: Recent Labs  Lab 11/18/20 1345  AST 18  ALT 11  ALKPHOS 45  BILITOT 0.6  PROT 7.8  ALBUMIN 3.5    Urine analysis:    Component Value Date/Time   COLORURINE YELLOW 11/18/2020 1326   APPEARANCEUR CLEAR 11/18/2020 1326   LABSPEC 1.015 11/18/2020 1326   PHURINE 6.0 11/18/2020 1326   GLUCOSEU NEGATIVE 11/18/2020 1326   HGBUR TRACE (A) 11/18/2020 1326   BILIRUBINUR NEGATIVE 11/18/2020 1326   BILIRUBINUR n 12/23/2015 1053   [place] 11/18/2020 1326   PROTEINUR NEGATIVE 11/18/2020 1326   UROBILINOGEN 0.2 12/23/2015 1053   NITRITE NEGATIVE 11/18/2020 1326   LEUKOCYTESUR NEGATIVE 11/18/2020 1326    Radiological Exams on Admission: CT Angio Chest PE W and/or Wo Contrast  Result Date: 11/18/2020 CLINICAL DATA:  History of tummy tuck 3 weeks ago with persistent pain and swelling in the surgical site as well as new right upper quadrant and chest pain. EXAM: CT ANGIOGRAPHY CHEST CT ABDOMEN AND PELVIS WITH CONTRAST TECHNIQUE: Multidetector CT imaging of the chest was performed using the standard protocol during bolus administration of intravenous contrast. Multiplanar CT image reconstructions and MIPs were obtained to evaluate the vascular anatomy. Multidetector CT imaging of the abdomen and pelvis was performed using the standard protocol during bolus administration of intravenous contrast. CONTRAST:  162m OMNIPAQUE IOHEXOL 350 MG/ML SOLN COMPARISON:  Ultrasound from earlier in the same day. FINDINGS: CTA CHEST FINDINGS Cardiovascular: Thoracic aorta and its branches demonstrate a normal branching pattern. No aneurysmal  dilatation or dissection is noted. No cardiac enlargement is seen. No pericardial effusion is noted. No coronary calcifications are seen. The pulmonary artery shows a normal branching pattern without definitive filling defect to suggest pulmonary embolism. Mediastinum/Nodes: Thoracic inlet is within normal limits. No sizable hilar or mediastinal adenopathy is noted. The esophagus as visualized is within normal limits. Lungs/Pleura: Mild dependent atelectatic changes are noted in the right lower lobe. No focal infiltrate or effusion is seen. Musculoskeletal: No chest wall abnormality. No acute or significant osseous findings. Review of the MIP images confirms the above findings. CT ABDOMEN and PELVIS FINDINGS Hepatobiliary: Liver is within normal limits. Changes consistent with cholelithiasis are seen. No biliary ductal dilatation is noted. Pancreas: Unremarkable. No pancreatic ductal dilatation or surrounding inflammatory changes. Spleen: Normal in size without focal abnormality. Adrenals/Urinary Tract: Adrenal glands are within normal limits. The kidneys demonstrate early excretion of contrast material. No obstructive changes are noted. The bladder is partially distended. Stomach/Bowel: Scattered fecal material is noted throughout the colon without obstructive change or significant constipation. The appendix is within normal limits. No small bowel or gastric abnormality is seen. Vascular/Lymphatic: No significant vascular findings are present. No enlarged abdominal or pelvic lymph nodes. Reproductive: Uterus and bilateral adnexa are unremarkable. Other: No free fluid is noted. There are changes in the anterior abdominal wall with subcutaneous edema consistent with the history of recent tummy tuck procedure. No focal fluid collection to suggest abscess formation is noted. Mild skin thickening is noted consistent with a degree of cellulitis. Musculoskeletal: No acute or significant osseous findings. Review of the MIP  images confirms the above findings. IMPRESSION: CTA of the chest: No definitive pulmonary emboli are seen. Mild right basilar atelectasis. CT of the abdomen and pelvis: Cholelithiasis without complicating factors. Mild retained fecal material without constipation. Changes consistent with the given clinical history of recent tummy tuck procedure with subcutaneous edema and mild skin thickening likely representing cellulitis. No focal abscess is noted. Electronically Signed   By: MInez Catalina  M.D.   On: 11/18/2020 21:34   CT ABDOMEN PELVIS W CONTRAST  Result Date: 11/18/2020 CLINICAL DATA:  History of tummy tuck 3 weeks ago with persistent pain and swelling in the surgical site as well as new right upper quadrant and chest pain. EXAM: CT ANGIOGRAPHY CHEST CT ABDOMEN AND PELVIS WITH CONTRAST TECHNIQUE: Multidetector CT imaging of the chest was performed using the standard protocol during bolus administration of intravenous contrast. Multiplanar CT image reconstructions and MIPs were obtained to evaluate the vascular anatomy. Multidetector CT imaging of the abdomen and pelvis was performed using the standard protocol during bolus administration of intravenous contrast. CONTRAST:  115m OMNIPAQUE IOHEXOL 350 MG/ML SOLN COMPARISON:  Ultrasound from earlier in the same day. FINDINGS: CTA CHEST FINDINGS Cardiovascular: Thoracic aorta and its branches demonstrate a normal branching pattern. No aneurysmal dilatation or dissection is noted. No cardiac enlargement is seen. No pericardial effusion is noted. No coronary calcifications are seen. The pulmonary artery shows a normal branching pattern without definitive filling defect to suggest pulmonary embolism. Mediastinum/Nodes: Thoracic inlet is within normal limits. No sizable hilar or mediastinal adenopathy is noted. The esophagus as visualized is within normal limits. Lungs/Pleura: Mild dependent atelectatic changes are noted in the right lower lobe. No focal infiltrate  or effusion is seen. Musculoskeletal: No chest wall abnormality. No acute or significant osseous findings. Review of the MIP images confirms the above findings. CT ABDOMEN and PELVIS FINDINGS Hepatobiliary: Liver is within normal limits. Changes consistent with cholelithiasis are seen. No biliary ductal dilatation is noted. Pancreas: Unremarkable. No pancreatic ductal dilatation or surrounding inflammatory changes. Spleen: Normal in size without focal abnormality. Adrenals/Urinary Tract: Adrenal glands are within normal limits. The kidneys demonstrate early excretion of contrast material. No obstructive changes are noted. The bladder is partially distended. Stomach/Bowel: Scattered fecal material is noted throughout the colon without obstructive change or significant constipation. The appendix is within normal limits. No small bowel or gastric abnormality is seen. Vascular/Lymphatic: No significant vascular findings are present. No enlarged abdominal or pelvic lymph nodes. Reproductive: Uterus and bilateral adnexa are unremarkable. Other: No free fluid is noted. There are changes in the anterior abdominal wall with subcutaneous edema consistent with the history of recent tummy tuck procedure. No focal fluid collection to suggest abscess formation is noted. Mild skin thickening is noted consistent with a degree of cellulitis. Musculoskeletal: No acute or significant osseous findings. Review of the MIP images confirms the above findings. IMPRESSION: CTA of the chest: No definitive pulmonary emboli are seen. Mild right basilar atelectasis. CT of the abdomen and pelvis: Cholelithiasis without complicating factors. Mild retained fecal material without constipation. Changes consistent with the given clinical history of recent tummy tuck procedure with subcutaneous edema and mild skin thickening likely representing cellulitis. No focal abscess is noted. Electronically Signed   By: MInez CatalinaM.D.   On: 11/18/2020 21:34    [place]Abdomen Limited RUQ (LIVER/GB)  Result Date: 11/18/2020 CLINICAL DATA:  Right upper quadrant pain.  Post tummy tuck. EXAM: ULTRASOUND ABDOMEN LIMITED RIGHT UPPER QUADRANT COMPARISON:  None. FINDINGS: Gallbladder: Multiple stones and sludge in the gallbladder fundus. Largest stone measures 1.8 cm. No wall thickening or edema. Murphy's sign is negative. Common bile duct: Diameter: 4 mm, normal Liver: No focal lesion identified. Within normal limits in parenchymal echogenicity. Portal vein is patent on color Doppler imaging with normal direction of blood flow towards the liver. Other: None. IMPRESSION: Cholelithiasis without evidence of acute cholecystitis. Electronically Signed   By: WGwyndolyn Saxon  Gerilyn Nestle M.D.   On: 11/18/2020 18:59    EKG: Independently reviewed.  Sinus tachycardia 123 bpm.  Assessment/Plan Principal Problem:   Cellulitis Active Problems:   Hypokalemia  Cellulitis > Patient presenting with right upper quadrant pain and tachycardia.  Found to have dehiscence of her incision site from her recent tummy tuck surgery performed 3 weeks ago and [place]. > She has completed a course of postop antibiotics about a week ago.  Has had some worsening dehiscence. > CT of the abdomen pelvis showed evidence of cellulitis around the incision site.  - Monitor on telemetry - Switch antibiotics from vancomycin and Zosyn to ceftriaxone - Trend fever curve and white count  Hypokalemia Potassium noted to be 3.3 in the ED - P.o. potassium 40 mEq - Check magnesium  DVT prophylaxis: Lovenox  Code Status:   Full  Family Communication:  None on admission Disposition Plan:   Patient is from:  Home  Anticipated DC to:  Home  Anticipated DC date:  1 to 3 days  Anticipated DC barriers: None  Consults called:  None  Admission status:  Observation, telemetry  Severity of Illness: The appropriate patient status for this patient is OBSERVATION. Observation status is judged to be reasonable and  necessary in order to provide the required intensity of service to ensure the patient's safety. The patient's presenting symptoms, physical exam findings, and initial radiographic and laboratory data in the context of their medical condition is felt to place them at decreased risk for further clinical deterioration. Furthermore, it is anticipated that the patient will be medically stable for discharge from the hospital within 2 midnights of admission. The following factors support the patient status of observation.   " The patient's presenting symptoms include tachycardia, right upper quadrant pain, evidence of cellulitis. " The physical exam findings include erythema at incision site with dehiscence. " The initial radiographic and laboratory data are  Lab work-up showed CMP with potassium of 3.3.  CBC showed leukocytosis to 10.6 and hemoglobin of 11.7 down from 12.71-year ago however she did have surgery performed 3 weeks ago.  Platelets elevated at 420.  Lipase normal.  Lactic acid pending.  D-dimer elevated 1.29.  Urinalysis normal.  Blood cultures pending.  Quadrant ultrasound showed cholelithiasis without cholecystitis.  CTA PE study showed no PE did show mild right basilar atelectasis.  CT of the abdomen pelvis showed cholelithiasis and mild retained fecal material.  Also demonstrated changes consistent with her history of tummy tuck and also subcutaneous edema and skin thickening likely representing cellulitis.     Marcelyn Bruins MD [facility]  How to contact the [facility] Attending or Consulting provider [place] or covering provider during after hours [place], for this patient?   Check the care team in [facility] and look for a) attending/consulting [facility] provider listed and b) the [facility] team listed Log into [email or web address] and use [place]'s universal password to access. If you do not have the password, please contact the hospital operator. Locate the [facility] provider you are looking for under [facility] and page to a number that you can be directly reached. If you still have difficulty reaching the provider, please page the [facility] (Director on Call) for the Hospitalists listed on amion for assistance.  11/19/2020, 12:00 AM

## 2020-11-18 NOTE — ED Triage Notes (Signed)
Patient here for evaluation of surgical site from tummy tuck that was done three weeks ago. Patient states the area around the incision is still very swollen and tender. Patient reports also now having RUQ pain and regularly having a heart rate in 130's according to the heart rate monitor on her watch.

## 2020-11-18 NOTE — ED Provider Notes (Signed)
[facility] EMERGENCY DEPARTMENT Provider Note   CSN: [id number] Arrival date & time: 11/18/20  1215     History Chief Complaint  Patient presents with   Wound Check    Jennifer Cardenas is a 45 y.o. female.   Wound Check Associated symptoms include abdominal pain and shortness of breath. Pertinent negatives include no chest pain.   45 year old female with a past medical history of anxiety, HSV 1 presenting to the emergency department for tachycardia and right upper quadrant abdominal pain.  Patient reports that she had an abdominal cosmetic surgery around 3 weeks ago.  She states that she has been following her surgeons advice regarding the wound care.  She states that she sent them a picture around a week ago because she was concerned that the Steri-Strips were falling off early.  She states that she completed the antibiotics they gave her also around a week ago.  She states that she is concerned because the wound appears to be spreading further apart.  She denies any fevers or chills.  She denies any pain around her incision site.  She states that she has had some right upper quadrant abdominal pain.  She notes that it is most typically after she eats.  Will go away roughly an hour after she eats.  No nausea or vomiting.  She states that it feels like a "bubbling" sensation in her right upper quadrant.  It is not currently present.  She states that she is most concerned because her apple watch has told her over the past several days, 3 to 4 days, that her heart rate has been around 130.  She states that her heart rate is typically in the high 90s or low 100s so that does not alarm her, but she states that the past 2 to 3 days it has been consistently around 125 which is abnormal for her.  She states that she does not have any chest pain, but has been getting some exertional shortness of breath.  She states that she will be able to complete 1 task, and then have to sit  down to rest which is atypical for her.  No history of PE or DVT.  Past Medical History:  Diagnosis Date   Acne    Allergy    Anxiety    Arthritis    Constipation    Herpes simplex    H/O HSV 1   and on leg   Hx of migraines    no aura   Hyperemesis    STD (sexually transmitted disease)    HSV 1    Patient Active Problem List   Diagnosis Date Noted   Palpitations 10/03/2016   Atypical chest pain 10/03/2016   Patellofemoral arthritis of right knee 08/31/2015   Synovitis of knee 08/31/2015   Chronic constipation 04/29/2014   Herpes simplex 12/26/2012   Migraine headache 12/13/2012    Past Surgical History:  Procedure Laterality Date   BREAST BIOPSY Right    benign core biopsy   BUNIONECTOMY Bilateral    1996 and 1998   excision of right breast mass Right    benign   UMBILICAL HERNIA REPAIR       OB History     Gravida  2   Para  2   Term  2   Preterm  0   AB  0   Living  2      SAB  0   IAB  0  Ectopic  0   Multiple  0   Live Births  2           Family History  Problem Relation Age of Onset   Bipolar disorder Mother    Hepatitis C Mother    Colon polyps Father    Atrial fibrillation Father    Hypertension Father    Hyperlipidemia Father    Breast cancer Maternal Grandmother    Leukemia Maternal Grandmother    Diabetes Other     Social History   Tobacco Use   Smoking status: Never   Smokeless tobacco: Never  Vaping Use   Vaping Use: Never used  Substance Use Topics   Alcohol use: Yes    Alcohol/week: 0.0 standard drinks    Comment: Occasionally    Drug use: No    Home Medications Prior to Admission medications   Medication Sig Start Date End Date Taking? Authorizing Provider  B Complex Vitamins (VITAMIN B COMPLEX PO) Take 1 tablet by mouth every evening.   Yes [provider]  Ferrous Sulfate (IRON PO) Take 1 tablet by mouth every evening.   Yes [provider]  Multiple Vitamins-Minerals (IMMUNE  SUPPORT PO) Take 1 tablet by mouth every evening. Vitamin C, Zinc, Elderberry & Ginger   Yes [provider]  albuterol (VENTOLIN HFA) 108 (90 Base) MCG/ACT inhaler Inhale 2 puffs into the lungs every 6 (six) hours as needed for wheezing or shortness of breath. Patient not taking: Reported on 11/18/2020 02/18/19   Dorothyann Peng, NP  Spacer/Aero-Holding Dorise Bullion Use with Ventolin to Inhale 2 puffs into the lungs every 6 hours PRN. 02/26/19   Nafziger, Tommi Rumps, NP  valACYclovir (VALTREX) 1000 MG tablet Take 2 tablets BID for one day as needed Patient not taking: No sig reported 06/19/18   Salvadore Dom, MD    Allergies    Benzoyl peroxide  Review of Systems   Review of Systems  Constitutional:  Negative for chills and fever.  HENT:  Negative for ear pain and sore throat.   Eyes:  Negative for pain and visual disturbance.  Respiratory:  Positive for shortness of breath. Negative for cough.   Cardiovascular:  Negative for chest pain and palpitations.  Gastrointestinal:  Positive for abdominal pain. Negative for nausea and vomiting.  Genitourinary:  Negative for dysuria and hematuria.  Musculoskeletal:  Negative for arthralgias and back pain.  Skin:  Positive for wound. Negative for color change and rash.  Neurological:  Negative for seizures and syncope.  All other systems reviewed and are negative.  Physical Exam Updated Vital Signs BP 134/70   Pulse (!) 118   Temp 98.8 F (37.1 C) (Oral)   Resp 16   SpO2 100%   Physical Exam Vitals and nursing note reviewed.  Constitutional:      General: She is not in acute distress.    Appearance: Normal appearance. She is well-developed and normal weight. She is not ill-appearing or toxic-appearing.  HENT:     Head: Normocephalic and atraumatic.  Eyes:     Conjunctiva/sclera: Conjunctivae normal.  Cardiovascular:     Rate and Rhythm: Regular rhythm. Tachycardia present.     Heart sounds: No murmur heard. Pulmonary:      Effort: Pulmonary effort is normal. No respiratory distress.     Breath sounds: Normal breath sounds.  Abdominal:     Palpations: Abdomen is soft.     Tenderness: There is no abdominal tenderness. There is no guarding or rebound.  Comments: Large lower transverse surgical incision. Some areas of wound dehiscence with granulation tissue present, but wound margins do not pull apart when placed under tension, does not probe past superficial layer.  Significant induration extending past the expected wound margins.  There is also some surrounding erythema and tenderness.  Musculoskeletal:     Cervical back: Neck supple.  Skin:    General: Skin is warm and dry.     Capillary Refill: Capillary refill takes less than 2 seconds.  Neurological:     Mental Status: She is alert and oriented to person, place, and time.    ED Results / Procedures / Treatments   Labs (all labs ordered are listed, but only abnormal results are displayed) Labs Reviewed  CBC WITH DIFFERENTIAL/PLATELET - Abnormal; Notable for the following components:      Result Value   WBC 10.6 (*)    Hemoglobin 11.7 (*)    HCT 34.9 (*)    Platelets 420 (*)    Monocytes Absolute 1.5 (*)    All other components within normal limits  COMPREHENSIVE METABOLIC PANEL - Abnormal; Notable for the following components:   Potassium 3.3 (*)    All other components within normal limits  URINALYSIS, ROUTINE W REFLEX MICROSCOPIC - Abnormal; Notable for the following components:   Hgb urine dipstick TRACE (*)    All other components within normal limits  D-DIMER, QUANTITATIVE - Abnormal; Notable for the following components:   D-Dimer, Quant 1.29 (*)    All other components within normal limits  CULTURE, BLOOD (ROUTINE X 2)  CULTURE, BLOOD (ROUTINE X 2)  LIPASE, BLOOD  URINALYSIS, MICROSCOPIC (REFLEX)  LACTIC ACID, PLASMA  MAGNESIUM  I-STAT BETA HCG BLOOD, ED ([facility], [facility], [facility] ONLY)    EKG EKG Interpretation  Date/Time:  Thursday November 18 2020 13:22:28 EDT Ventricular Rate:  123 PR Interval:  150 QRS Duration: 82 QT Interval:  302 QTC Calculation: 432 R Axis:   38 Text Interpretation: Sinus tachycardia Nonspecific T wave abnormality Confirmed by Lajean Saver [phone]) on 11/18/2020 6:09:29 PM  Radiology CT Angio Chest PE W and/or Wo Contrast  Result Date: 11/18/2020 CLINICAL DATA:  History of tummy tuck 3 weeks ago with persistent pain and swelling in the surgical site as well as new right upper quadrant and chest pain. EXAM: CT ANGIOGRAPHY CHEST CT ABDOMEN AND PELVIS WITH CONTRAST TECHNIQUE: Multidetector CT imaging of the chest was performed using the standard protocol during bolus administration of intravenous contrast. Multiplanar CT image reconstructions and MIPs were obtained to evaluate the vascular anatomy. Multidetector CT imaging of the abdomen and pelvis was performed using the standard protocol during bolus administration of intravenous contrast. CONTRAST:  145m OMNIPAQUE IOHEXOL 350 MG/ML SOLN COMPARISON:  Ultrasound from earlier in the same day. FINDINGS: CTA CHEST FINDINGS Cardiovascular: Thoracic aorta and its branches demonstrate a normal branching pattern. No aneurysmal dilatation or dissection is noted. No cardiac enlargement is seen. No pericardial effusion is noted. No coronary calcifications are seen. The pulmonary artery shows a normal branching pattern without definitive filling defect to suggest pulmonary embolism. Mediastinum/Nodes: Thoracic inlet is within normal limits. No sizable hilar or mediastinal adenopathy is noted. The esophagus as visualized is within normal limits. Lungs/Pleura: Mild dependent atelectatic changes are noted in the right lower lobe. No focal infiltrate or effusion is seen. Musculoskeletal: No chest wall abnormality. No acute or significant osseous findings. Review of the MIP images confirms the above findings. CT ABDOMEN and PELVIS FINDINGS Hepatobiliary: Liver is within  normal limits.  Changes consistent with cholelithiasis are seen. No biliary ductal dilatation is noted. Pancreas: Unremarkable. No pancreatic ductal dilatation or surrounding inflammatory changes. Spleen: Normal in size without focal abnormality. Adrenals/Urinary Tract: Adrenal glands are within normal limits. The kidneys demonstrate early excretion of contrast material. No obstructive changes are noted. The bladder is partially distended. Stomach/Bowel: Scattered fecal material is noted throughout the colon without obstructive change or significant constipation. The appendix is within normal limits. No small bowel or gastric abnormality is seen. Vascular/Lymphatic: No significant vascular findings are present. No enlarged abdominal or pelvic lymph nodes. Reproductive: Uterus and bilateral adnexa are unremarkable. Other: No free fluid is noted. There are changes in the anterior abdominal wall with subcutaneous edema consistent with the history of recent tummy tuck procedure. No focal fluid collection to suggest abscess formation is noted. Mild skin thickening is noted consistent with a degree of cellulitis. Musculoskeletal: No acute or significant osseous findings. Review of the MIP images confirms the above findings. IMPRESSION: CTA of the chest: No definitive pulmonary emboli are seen. Mild right basilar atelectasis. CT of the abdomen and pelvis: Cholelithiasis without complicating factors. Mild retained fecal material without constipation. Changes consistent with the given clinical history of recent tummy tuck procedure with subcutaneous edema and mild skin thickening likely representing cellulitis. No focal abscess is noted. Electronically Signed   By: Inez Catalina M.D.   On: 11/18/2020 21:34   CT ABDOMEN PELVIS W CONTRAST  Result Date: 11/18/2020 CLINICAL DATA:  History of tummy tuck 3 weeks ago with persistent pain and swelling in the surgical site as well as new right upper quadrant and chest pain. EXAM: CT ANGIOGRAPHY  CHEST CT ABDOMEN AND PELVIS WITH CONTRAST TECHNIQUE: Multidetector CT imaging of the chest was performed using the standard protocol during bolus administration of intravenous contrast. Multiplanar CT image reconstructions and MIPs were obtained to evaluate the vascular anatomy. Multidetector CT imaging of the abdomen and pelvis was performed using the standard protocol during bolus administration of intravenous contrast. CONTRAST:  169m OMNIPAQUE IOHEXOL 350 MG/ML SOLN COMPARISON:  Ultrasound from earlier in the same day. FINDINGS: CTA CHEST FINDINGS Cardiovascular: Thoracic aorta and its branches demonstrate a normal branching pattern. No aneurysmal dilatation or dissection is noted. No cardiac enlargement is seen. No pericardial effusion is noted. No coronary calcifications are seen. The pulmonary artery shows a normal branching pattern without definitive filling defect to suggest pulmonary embolism. Mediastinum/Nodes: Thoracic inlet is within normal limits. No sizable hilar or mediastinal adenopathy is noted. The esophagus as visualized is within normal limits. Lungs/Pleura: Mild dependent atelectatic changes are noted in the right lower lobe. No focal infiltrate or effusion is seen. Musculoskeletal: No chest wall abnormality. No acute or significant osseous findings. Review of the MIP images confirms the above findings. CT ABDOMEN and PELVIS FINDINGS Hepatobiliary: Liver is within normal limits. Changes consistent with cholelithiasis are seen. No biliary ductal dilatation is noted. Pancreas: Unremarkable. No pancreatic ductal dilatation or surrounding inflammatory changes. Spleen: Normal in size without focal abnormality. Adrenals/Urinary Tract: Adrenal glands are within normal limits. The kidneys demonstrate early excretion of contrast material. No obstructive changes are noted. The bladder is partially distended. Stomach/Bowel: Scattered fecal material is noted throughout the colon without obstructive change  or significant constipation. The appendix is within normal limits. No small bowel or gastric abnormality is seen. Vascular/Lymphatic: No significant vascular findings are present. No enlarged abdominal or pelvic lymph nodes. Reproductive: Uterus and bilateral adnexa are unremarkable. Other: No free fluid is  noted. There are changes in the anterior abdominal wall with subcutaneous edema consistent with the history of recent tummy tuck procedure. No focal fluid collection to suggest abscess formation is noted. Mild skin thickening is noted consistent with a degree of cellulitis. Musculoskeletal: No acute or significant osseous findings. Review of the MIP images confirms the above findings. IMPRESSION: CTA of the chest: No definitive pulmonary emboli are seen. Mild right basilar atelectasis. CT of the abdomen and pelvis: Cholelithiasis without complicating factors. Mild retained fecal material without constipation. Changes consistent with the given clinical history of recent tummy tuck procedure with subcutaneous edema and mild skin thickening likely representing cellulitis. No focal abscess is noted. Electronically Signed   By: Inez Catalina M.D.   On: 11/18/2020 21:34   US Abdomen Limited RUQ (LIVER/GB)  Result Date: 11/18/2020 CLINICAL DATA:  Right upper quadrant pain.  Post tummy tuck. EXAM: ULTRASOUND ABDOMEN LIMITED RIGHT UPPER QUADRANT COMPARISON:  None. FINDINGS: Gallbladder: Multiple stones and sludge in the gallbladder fundus. Largest stone measures 1.8 cm. No wall thickening or edema. Murphy's sign is negative. Common bile duct: Diameter: 4 mm, normal Liver: No focal lesion identified. Within normal limits in parenchymal echogenicity. Portal vein is patent on color Doppler imaging with normal direction of blood flow towards the liver. Other: None. IMPRESSION: Cholelithiasis without evidence of acute cholecystitis. Electronically Signed   By: Lucienne Capers M.D.   On: 11/18/2020 18:59     Procedures Procedures   Medications Ordered in ED Medications  piperacillin-tazobactam (ZOSYN) IVPB 3.375 g (has no administration in time range)  vancomycin (VANCOREADY) IVPB 2000 mg/400 mL (has no administration in time range)  lactated ringers bolus 1,000 mL (0 mLs Intravenous Stopped 11/18/20 2319)  iohexol (OMNIPAQUE) 350 MG/ML injection 100 mL (100 mLs Intravenous Contrast Given 11/18/20 2119)    ED Course  I have reviewed the triage vital signs and the nursing notes.  Pertinent labs & imaging results that were available during my care of the patient were reviewed by me and considered in my medical decision making (see chart for details).    MDM Rules/Calculators/A&P                           45 year old female with above past medical history and recent abdominal surgery presenting with chest pain, abdominal pain, tachycardia.  Vital signs reviewed, the patient is markedly tachycardic between 130 and 140.  On chart review, it seems the patient is baseline is between 90 and 115.  She is more tachycardic than usual today.  She also endorses some exertional shortness of breath.  Differential includes systemic infection, postoperative complication, pulmonary embolism.  Given that she is 3 weeks postop, and is more tachycardic than usual, was complaining of exertional shortness of breath, will obtain CTA PE study to evaluate for pulmonary embolism.  Given her right upper quadrant pain after eating, will obtain a right upper quadrant ultrasound to evaluate for cholelithiasis or cholecystitis.  Also obtain a CT of the abdomen to evaluate for an abscess associated with her wound.  It does not appear dehisced to the level of the peritoneum, but the patient showed me a picture of it roughly a week ago and the more wound certainly looks worse than it did at that time.  The wound edges are more retracted and there is more erythema that appears in the pictures.   CBC shows a leukocytosis of 10.6.   Platelet count of 420.  Metabolic  panel unremarkable.  Normal lipase.  Pregnancy negative.  CT scan showed no pulmonary embolism.  She has cholelithiasis.  Likely symptomatic cholelithiasis, although she is currently pain-free.  She will need an outpatient referral to general surgery but no emergent consultation indicated.  For the patient's abdominal incision, there are changes concerning for cellulitis but no associated abscess.  She already completed a course of outpatient antibiotics for this a week ago and it appears to continue to worsen.  Additionally she has more systemic symptoms than she did a week ago.  Her heart rate has improved with fluids, but I believe that she still warrants inpatient IV antibiotics given recent outpatient antibiotic course did not help her symptoms.  Handoff given to admitting team.   Final Clinical Impression(s) / ED Diagnoses Final diagnoses:  RUQ abdominal pain  SOB (shortness of breath)  Cellulitis of abdominal wall    Rx / DC Orders ED Discharge Orders     None        Claud Kelp, MD 11/18/20 2347    Lajean Saver, MD 11/20/20 1712

## 2020-11-19 ENCOUNTER — Other Ambulatory Visit: Payer: Self-pay

## 2020-11-19 DIAGNOSIS — L0390 Cellulitis, unspecified: Secondary | ICD-10-CM

## 2020-11-19 DIAGNOSIS — T8131XA Disruption of external operation (surgical) wound, not elsewhere classified, initial encounter: Secondary | ICD-10-CM

## 2020-11-19 DIAGNOSIS — E876 Hypokalemia: Secondary | ICD-10-CM

## 2020-11-19 LAB — LACTIC ACID, PLASMA: Lactic Acid, Venous: 1.2 mmol/L (ref 0.5–1.9)

## 2020-11-19 LAB — RESP PANEL BY RT-PCR (FLU A&B, COVID) ARPGX2
Influenza A by PCR: NEGATIVE
Influenza B by PCR: NEGATIVE
SARS Coronavirus 2 by RT PCR: NEGATIVE

## 2020-11-19 LAB — HIV ANTIBODY (ROUTINE TESTING W REFLEX): HIV Screen 4th Generation wRfx: NONREACTIVE

## 2020-11-19 LAB — MAGNESIUM: Magnesium: 1.7 mg/dL (ref 1.7–2.4)

## 2020-11-19 NOTE — Progress Notes (Signed)
Orthopedic Tech Progress Note Patient Details:  Jennifer Cardenas [facility] [birth year] [id number] RN applied binder Ortho Devices Type of Ortho Device: Abdominal binder Ortho Device/Splint Interventions: Ordered   Post Interventions Patient Tolerated: Other (comment) Instructions Provided: Other (comment)  Ellouise Newer 11/19/2020, 11:11 PM

## 2020-11-19 NOTE — Progress Notes (Signed)
PROGRESS NOTE  Jennifer Cardenas  DOB: 12-Jul-1975  PCP: Dorothyann Peng, NP [place]:[id number]  DOA: 11/18/2020  LOS: 0 days  Hospital Day: 2   Chief Complaint  Patient presents with   Wound Check    Brief narrative: Jennifer Cardenas is a 45 y.o. female with PMH significant for constipation, migraine, arthritis, anxiety, HSV who underwent a tummy tuck procedure done in [place] 3 weeks ago.   Patient presented to the ED on 8/25 with tachycardia and right upper quadrant pain. She had a procedure done on 8/5 and completed 1 week of antibiotics as advised despite which, she noted soreness at her incision site which started gaping as well.    In the ED, patient was tachycardic to 120s, WBC count elevated to 10.6, lactic acid normal. CT of the abdomen pelvis showed cellulitis around the incision site with no focal collection.   Admitted to hospital service for further evaluation management   Subjective: Patient was seen and examined this morning.  Pleasant middle-aged African-American female.  Propped up in bed.  Not in distress.  Pain controlled at this time.  Remains tachycardic.  Assessment/Plan: Surgical site infection -Presented with surgical incision infection at the site of tummy tuck procedure done 3 weeks ago  -Noted wound dehiscence and gaping.   -Not septic at presentation.  Patient states that she usually runs tachycardic because of PACs and PVCs.   -On admission, she was started on IV Rocephin.   -I discussed the case with plastic surgeon Dr. Marla Roe for a formal consult.   -Continue to monitor Recent Labs  Lab 11/18/20 1345 11/18/20 2348  WBC 10.6*  --   LATICACIDVEN  --  1.2   Hypokalemia -Oral replacement given. Recent Labs  Lab 11/18/20 1345 11/18/20 2348  K 3.3*  --   MG  --  1.7   Mobility: Encourage ambulation Code Status:   Code Status: Full Code  Nutritional status: Body mass index is 31.32 kg/m.     Diet:  Diet Order             Diet regular Room  service appropriate? Yes; Fluid consistency: Thin  Diet effective now                  DVT prophylaxis:  enoxaparin (LOVENOX) injection 40 mg Start: 11/19/20 1000   Antimicrobials: IV Rocephin Fluid: None Consultants: Plastic surgery Family Communication: None at bedside   Status is: Observation  Remains inpatient appropriate because: Needs IV antibiotics  Dispo: The patient is from: Home              Anticipated d/c is to: Home              Patient currently is not medically stable to d/c.   Difficult to place patient No     Infusions:   cefTRIAXone (ROCEPHIN)  IV Stopped (11/19/20 [id number])    Scheduled Meds:  enoxaparin (LOVENOX) injection  40 mg Subcutaneous Q24H   sodium chloride flush  3 mL Intravenous Q12H    Antimicrobials: Anti-infectives (From admission, onward)    Start     Dose/Rate Route Frequency Ordered Stop   11/19/20 0600  cefTRIAXone (ROCEPHIN) 1 g in sodium chloride 0.9 % 100 mL IVPB        1 g 200 mL/hr over 30 Minutes Intravenous Every 24 hours 11/18/20 2352 11/26/20 0559   11/18/20 2230  vancomycin (VANCOREADY) IVPB 2000 mg/400 mL        2,000 mg 200  mL/hr over 120 Minutes Intravenous Once 11/18/20 2217 11/19/20 0240   11/18/20 2215  vancomycin (VANCOCIN) 2,000 mg in sodium chloride 0.9 % 500 mL IVPB  Status:  Discontinued        2,000 mg 250 mL/hr over 120 Minutes Intravenous  Once 11/18/20 2201 11/18/20 2217   11/18/20 2215  piperacillin-tazobactam (ZOSYN) IVPB 3.375 g        3.375 g 12.5 mL/hr over 240 Minutes Intravenous Once 11/18/20 2201 11/19/20 0115       PRN meds: acetaminophen **OR** acetaminophen, HYDROcodone-acetaminophen, polyethylene glycol   Objective: Vitals:   11/19/20 0614 11/19/20 0900  BP: 124/63 (!) 119/57  Pulse: 93 (!) 106  Resp: 17 16  Temp: 98.1 F (36.7 C)   SpO2: 100% 100%    Intake/Output Summary (Last 24 hours) at 11/19/2020 1128 Last data filed at 11/19/2020 1043 Gross per 24 hour  Intake 715.44 ml   Output --  Net 715.44 ml   Filed Weights   11/19/20 0244  Weight: 90.7 kg   Weight change:  Body mass index is 31.32 kg/m.   Physical Exam: General exam: Pleasant, middle-aged African-American female.  Not in distress at rest Skin: No rashes, lesions or ulcers. HEENT: Atraumatic, normocephalic, no obvious bleeding Lungs: Clear to auscultation bilaterally CVS: Regular rate and rhythm, no murmur GI/Abd soft, abdominal binder in place, soaked at places because of oozing.  Abdominal wound with gaping noted CNS: Alert, awake, oriented x3 Psychiatry: Mood appropriate Extremities: No pedal edema, no calf tenderness  Data Review: I have personally reviewed the laboratory data and studies available.  Recent Labs  Lab 11/18/20 1345  WBC 10.6*  NEUTROABS 7.5  HGB 11.7*  HCT 34.9*  MCV 87.0  PLT 420*   Recent Labs  Lab 11/18/20 1345 11/18/20 2348  NA 137  --   K 3.3*  --   CL 101  --   CO2 27  --   GLUCOSE 89  --   BUN 10  --   CREATININE 0.69  --   CALCIUM 9.4  --   MG  --  1.7    F/u labs ordered Unresulted Labs (From admission, onward)     Start     Ordered   11/25/20 0500  Creatinine, serum  (enoxaparin (LOVENOX)    CrCl >/= 30 ml/min)  Weekly,   R     Comments: while on enoxaparin therapy    11/18/20 2352   11/19/20 0500  CBC  Tomorrow morning,   R        11/18/20 2352   11/19/20 [id number]  Basic metabolic panel  Tomorrow morning,   R        11/18/20 2352   11/18/20 2140  Blood culture (routine x 2)  BLOOD CULTURE X 2,   STAT      11/18/20 2139            Signed, Terrilee Croak, MD [facility] 11/19/2020

## 2020-11-19 NOTE — Progress Notes (Addendum)
Patient is a 45 year old female with PMH significant for recent abdominoplasty performed in [place] approximately 3 weeks ago who was admitted to the hospital for continued IV antibiotics in the setting of surgical site infection.  Plastic surgery was consulted.  Reviewed patient's medical record.  CBC with a very mild leukocytosis to 10.6.  Afebrile on presentation, but tachycardic.  CTA ordered, no pulmonary emboli.  CT with findings suggestive of cellulitis, but without focal abscess.  Patient was admitted for IV Rocephin.  Subjective:  On my examination, patient is resting comfortably in bed.  She states that her daughter is coming to the ER shortly to provide her with new dressing changes.  She is an Therapist, sports.  She states that she went to [facility] because she has family in the area.  There were no specific postop follow-up plans.    She states that she was without any significant abdominal pain, but noticed postsurgical changes shortly after conclusion of her Augmentin antibiotic.  She was worried about possible keloid formation.  She sent a picture to her plastic surgeon who advised her to apply topical antibiotics.  She states that she did have a left-sided JP drain that she removed approximately 1 week ago due to only scant drainage, less than 30 cc/day.  She denies any fevers, chills, or significant abdominal discomfort outside of her known symptomatic cholelithiasis.  She did however noticed that her heart rate was elevated as high as 160s which prompted her to come to the ED for evaluation.  Objective: Vital signs in last 24 hours: Temp:  [98.1 F (36.7 C)-98.8 F (37.1 C)] 98.1 F (36.7 C) (08/26 0614) Pulse Rate:  [93-127] 108 (08/26 1345) Resp:  [16-20] 18 (08/26 1345) BP: (114-145)/(57-88) 131/73 (08/26 1345) SpO2:  [99 %-100 %] 100 % (08/26 1345) Weight:  [90.7 kg] 90.7 kg (08/26 0244)    Intake/Output from previous day: No intake/output data recorded. Intake/Output this  shift: Total I/O In: 715.4 [I.V.:715.4] Out: -   General appearance: alert and no distress GI: Abdomen is nondistended.  Mildly tender around incision site. Extremities: No lower extremity edema or swelling. Pulses: 2+ and symmetric Peripheral pulses intact and symmetric. Incision/Wound: Incision appears largely intact.  There is some superficial necrosis at midline.  Mild necrosis around the umbilicus.  No significant drainage.    Lab Results:  CBC Latest Ref Rng & Units 11/18/2020 12/26/2016 09/26/2016  WBC 4.0 - 10.5 K/uL 10.6(H) 8.4 10.5  Hemoglobin 12.0 - 15.0 g/dL 11.7(L) 13.4 12.6  Hematocrit 36.0 - 46.0 % 34.9(L) 41.0 38.1  Platelets 150 - 400 K/uL 420(H) 240.0 200    BMET Recent Labs    11/18/20 1345  NA 137  K 3.3*  CL 101  CO2 27  GLUCOSE 89  BUN 10  CREATININE 0.69  CALCIUM 9.4   PT/INR No results for input(s): LABPROT, INR in the last 72 hours. ABG No results for input(s): PHART, HCO3 in the last 72 hours.  Invalid input(s): PCO2, PO2  Studies/Results: CT Angio Chest PE W and/or Wo Contrast  Result Date: 11/18/2020 CLINICAL DATA:  History of tummy tuck 3 weeks ago with persistent pain and swelling in the surgical site as well as new right upper quadrant and chest pain. EXAM: CT ANGIOGRAPHY CHEST CT ABDOMEN AND PELVIS WITH CONTRAST TECHNIQUE: Multidetector CT imaging of the chest was performed using the standard protocol during bolus administration of intravenous contrast. Multiplanar CT image reconstructions and MIPs were obtained to evaluate the vascular anatomy. Multidetector CT  imaging of the abdomen and pelvis was performed using the standard protocol during bolus administration of intravenous contrast. CONTRAST:  180m OMNIPAQUE IOHEXOL 350 MG/ML SOLN COMPARISON:  Ultrasound from earlier in the same day. FINDINGS: CTA CHEST FINDINGS Cardiovascular: Thoracic aorta and its branches demonstrate a normal branching pattern. No aneurysmal dilatation or dissection  is noted. No cardiac enlargement is seen. No pericardial effusion is noted. No coronary calcifications are seen. The pulmonary artery shows a normal branching pattern without definitive filling defect to suggest pulmonary embolism. Mediastinum/Nodes: Thoracic inlet is within normal limits. No sizable hilar or mediastinal adenopathy is noted. The esophagus as visualized is within normal limits. Lungs/Pleura: Mild dependent atelectatic changes are noted in the right lower lobe. No focal infiltrate or effusion is seen. Musculoskeletal: No chest wall abnormality. No acute or significant osseous findings. Review of the MIP images confirms the above findings. CT ABDOMEN and PELVIS FINDINGS Hepatobiliary: Liver is within normal limits. Changes consistent with cholelithiasis are seen. No biliary ductal dilatation is noted. Pancreas: Unremarkable. No pancreatic ductal dilatation or surrounding inflammatory changes. Spleen: Normal in size without focal abnormality. Adrenals/Urinary Tract: Adrenal glands are within normal limits. The kidneys demonstrate early excretion of contrast material. No obstructive changes are noted. The bladder is partially distended. Stomach/Bowel: Scattered fecal material is noted throughout the colon without obstructive change or significant constipation. The appendix is within normal limits. No small bowel or gastric abnormality is seen. Vascular/Lymphatic: No significant vascular findings are present. No enlarged abdominal or pelvic lymph nodes. Reproductive: Uterus and bilateral adnexa are unremarkable. Other: No free fluid is noted. There are changes in the anterior abdominal wall with subcutaneous edema consistent with the history of recent tummy tuck procedure. No focal fluid collection to suggest abscess formation is noted. Mild skin thickening is noted consistent with a degree of cellulitis. Musculoskeletal: No acute or significant osseous findings. Review of the MIP images confirms the  above findings. IMPRESSION: CTA of the chest: No definitive pulmonary emboli are seen. Mild right basilar atelectasis. CT of the abdomen and pelvis: Cholelithiasis without complicating factors. Mild retained fecal material without constipation. Changes consistent with the given clinical history of recent tummy tuck procedure with subcutaneous edema and mild skin thickening likely representing cellulitis. No focal abscess is noted. Electronically Signed   By: MInez CatalinaM.D.   On: 11/18/2020 21:34   CT ABDOMEN PELVIS W CONTRAST  Result Date: 11/18/2020 CLINICAL DATA:  History of tummy tuck 3 weeks ago with persistent pain and swelling in the surgical site as well as new right upper quadrant and chest pain. EXAM: CT ANGIOGRAPHY CHEST CT ABDOMEN AND PELVIS WITH CONTRAST TECHNIQUE: Multidetector CT imaging of the chest was performed using the standard protocol during bolus administration of intravenous contrast. Multiplanar CT image reconstructions and MIPs were obtained to evaluate the vascular anatomy. Multidetector CT imaging of the abdomen and pelvis was performed using the standard protocol during bolus administration of intravenous contrast. CONTRAST:  1049mOMNIPAQUE IOHEXOL 350 MG/ML SOLN COMPARISON:  Ultrasound from earlier in the same day. FINDINGS: CTA CHEST FINDINGS Cardiovascular: Thoracic aorta and its branches demonstrate a normal branching pattern. No aneurysmal dilatation or dissection is noted. No cardiac enlargement is seen. No pericardial effusion is noted. No coronary calcifications are seen. The pulmonary artery shows a normal branching pattern without definitive filling defect to suggest pulmonary embolism. Mediastinum/Nodes: Thoracic inlet is within normal limits. No sizable hilar or mediastinal adenopathy is noted. The esophagus as visualized is within normal limits. Lungs/Pleura: Mild  dependent atelectatic changes are noted in the right lower lobe. No focal infiltrate or effusion is seen.  Musculoskeletal: No chest wall abnormality. No acute or significant osseous findings. Review of the MIP images confirms the above findings. CT ABDOMEN and PELVIS FINDINGS Hepatobiliary: Liver is within normal limits. Changes consistent with cholelithiasis are seen. No biliary ductal dilatation is noted. Pancreas: Unremarkable. No pancreatic ductal dilatation or surrounding inflammatory changes. Spleen: Normal in size without focal abnormality. Adrenals/Urinary Tract: Adrenal glands are within normal limits. The kidneys demonstrate early excretion of contrast material. No obstructive changes are noted. The bladder is partially distended. Stomach/Bowel: Scattered fecal material is noted throughout the colon without obstructive change or significant constipation. The appendix is within normal limits. No small bowel or gastric abnormality is seen. Vascular/Lymphatic: No significant vascular findings are present. No enlarged abdominal or pelvic lymph nodes. Reproductive: Uterus and bilateral adnexa are unremarkable. Other: No free fluid is noted. There are changes in the anterior abdominal wall with subcutaneous edema consistent with the history of recent tummy tuck procedure. No focal fluid collection to suggest abscess formation is noted. Mild skin thickening is noted consistent with a degree of cellulitis. Musculoskeletal: No acute or significant osseous findings. Review of the MIP images confirms the above findings. IMPRESSION: CTA of the chest: No definitive pulmonary emboli are seen. Mild right basilar atelectasis. CT of the abdomen and pelvis: Cholelithiasis without complicating factors. Mild retained fecal material without constipation. Changes consistent with the given clinical history of recent tummy tuck procedure with subcutaneous edema and mild skin thickening likely representing cellulitis. No focal abscess is noted. Electronically Signed   By: Inez Catalina M.D.   On: 11/18/2020 21:34   US Abdomen Limited  RUQ (LIVER/GB)  Result Date: 11/18/2020 CLINICAL DATA:  Right upper quadrant pain.  Post tummy tuck. EXAM: ULTRASOUND ABDOMEN LIMITED RIGHT UPPER QUADRANT COMPARISON:  None. FINDINGS: Gallbladder: Multiple stones and sludge in the gallbladder fundus. Largest stone measures 1.8 cm. No wall thickening or edema. Murphy's sign is negative. Common bile duct: Diameter: 4 mm, normal Liver: No focal lesion identified. Within normal limits in parenchymal echogenicity. Portal vein is patent on color Doppler imaging with normal direction of blood flow towards the liver. Other: None. IMPRESSION: Cholelithiasis without evidence of acute cholecystitis. Electronically Signed   By: Lucienne Capers M.D.   On: 11/18/2020 18:59    Anti-infectives: Anti-infectives (From admission, onward)    Start     Dose/Rate Route Frequency Ordered Stop   11/19/20 0600  cefTRIAXone (ROCEPHIN) 1 g in sodium chloride 0.9 % 100 mL IVPB        1 g 200 mL/hr over 30 Minutes Intravenous Every 24 hours 11/18/20 2352 11/26/20 0559   11/18/20 2230  vancomycin (VANCOREADY) IVPB 2000 mg/400 mL        2,000 mg 200 mL/hr over 120 Minutes Intravenous Once 11/18/20 2217 11/19/20 0240   11/18/20 2215  vancomycin (VANCOCIN) 2,000 mg in sodium chloride 0.9 % 500 mL IVPB  Status:  Discontinued        2,000 mg 250 mL/hr over 120 Minutes Intravenous  Once 11/18/20 2201 11/18/20 2217   11/18/20 2215  piperacillin-tazobactam (ZOSYN) IVPB 3.375 g        3.375 g 12.5 mL/hr over 240 Minutes Intravenous Once 11/18/20 2201 11/19/20 0115       Assessment/Plan: s/p     Patient has superficial necrosis at incision site.  Evidence of mild cellulitis.  Continue with IV antibiotics per primary team.  No significant dehiscence or drainage noted.  Her abdomen is nondistended.  Denies abdominal pain at present.  Denies any fevers or chills at home.  Do not feel as though she needs any immediate debridement.  Recommend daily dressing changes with Xeroform,  ABD pads, and abdominal binder.  We will continue to follow as needed.  She can follow-up with her surgeon in [place] for ongoing management.   LOS: 0 days    Krista Blue, PA-C 11/19/2020

## 2020-11-19 NOTE — ED Notes (Signed)
Breakfast Ordered 

## 2020-11-20 ENCOUNTER — Encounter (HOSPITAL_COMMUNITY): Payer: Self-pay | Admitting: Internal Medicine

## 2020-11-20 LAB — CBC
HCT: 31.5 % — ABNORMAL LOW (ref 36.0–46.0)
Hemoglobin: 10.7 g/dL — ABNORMAL LOW (ref 12.0–15.0)
MCH: 29.1 pg (ref 26.0–34.0)
MCHC: 34 g/dL (ref 30.0–36.0)
MCV: 85.6 fL (ref 80.0–100.0)
Platelets: 384 10*3/uL (ref 150–400)
RBC: 3.68 MIL/uL — ABNORMAL LOW (ref 3.87–5.11)
RDW: 13.8 % (ref 11.5–15.5)
WBC: 8.6 10*3/uL (ref 4.0–10.5)
nRBC: 0 % (ref 0.0–0.2)

## 2020-11-20 LAB — BASIC METABOLIC PANEL
Anion gap: 10 (ref 5–15)
BUN: <5 mg/dL — ABNORMAL LOW (ref 6–20)
CO2: 24 mmol/L (ref 22–32)
Calcium: 9 mg/dL (ref 8.9–10.3)
Chloride: 103 mmol/L (ref 98–111)
Creatinine, Ser: 0.68 mg/dL (ref 0.44–1.00)
GFR, Estimated: >60 mL/min (ref >60)
Glucose, Bld: 108 mg/dL — ABNORMAL HIGH (ref 70–99)
Potassium: 3.3 mmol/L — ABNORMAL LOW (ref 3.5–5.1)
Sodium: 137 mmol/L (ref 135–145)

## 2020-11-20 MED ORDER — CALCIUM CARBONATE ANTACID 500 MG PO CHEW: 1.0000 | CHEWABLE_TABLET | Freq: Three times a day (TID) | ORAL | Status: DC | PRN

## 2020-11-20 MED ORDER — DOXYCYCLINE HYCLATE 100 MG PO TABS
100.0000 mg | ORAL_TABLET | Freq: Two times a day (BID) | ORAL | Status: DC
  Administered 2020-11-20 – 2020-11-22 (×5): 100 mg via ORAL
  Filled 2020-11-20 (×5): qty 1

## 2020-11-20 MED ORDER — POTASSIUM CHLORIDE CRYS ER 20 MEQ PO TBCR
40.0000 meq | EXTENDED_RELEASE_TABLET | Freq: Once | ORAL | Status: AC
  Administered 2020-11-20: 40 meq via ORAL
  Filled 2020-11-20: qty 2

## 2020-11-20 MED ORDER — PANTOPRAZOLE SODIUM 40 MG PO TBEC
40.0000 mg | DELAYED_RELEASE_TABLET | Freq: Every day | ORAL | Status: DC
  Administered 2020-11-20 – 2020-11-22 (×3): 40 mg via ORAL
  Filled 2020-11-20 (×3): qty 1

## 2020-11-20 NOTE — Progress Notes (Signed)
PROGRESS NOTE  [name]  DOB: 04/25/1975  PCP: Dorothyann Peng, NP [id number]  DOA: 11/18/2020  LOS: 1 day  Hospital Day: 3   Chief Complaint  Patient presents with   Wound Check    Brief narrative: Jennifer Cardenas is a 45 y.o. female with PMH significant for constipation, migraine, arthritis, anxiety, HSV who underwent a tummy tuck procedure done in [place] 3 weeks ago.   Patient presented to the ED on 8/25 with tachycardia and right upper quadrant pain. She had a procedure done on 8/5 and completed 1 week of antibiotics as advised despite which, she noted soreness at her incision site which started gaping as well.    In the ED, patient was tachycardic to 120s, WBC count elevated to 10.6, lactic acid normal. CT of the abdomen pelvis showed cellulitis around the incision site with no focal collection.   Admitted to hospital service for further evaluation management   Subjective: Patient was seen and examined this morning.  Pleasant middle-aged African-American female.  Propped up in bed.  Not in distress.  Pain controlled at this time.  Remains tachycardic.  Assessment/Plan: Surgical site infection -Presented with surgical incision infection at the site of tummy tuck procedure done 3 weeks ago  -Plastic surgery consultation obtained with Dr. Marla Roe.  No need of surgical intervention at this time. -Currently remains on IV Rocephin.  I will continue her on IV antibiotics next 24 to 48 hours and hopefully discharge on oral.  I would add doxycycline for MRSA coverage Recent Labs  Lab 11/18/20 1345 11/18/20 2348 11/20/20 0843  WBC 10.6*  --  8.6  LATICACIDVEN  --  1.2  --    Hypokalemia -Potassium level remains low, 3.3 today.  Oral replacement given. Recent Labs  Lab 11/18/20 1345 11/18/20 2348 11/20/20 0843  K 3.3*  --  3.3*  MG  --  1.7  --    Sinus tachycardia -Patient states that she usually runs tachycardic because of PACs and PVCs.  -Sinus tachycardia  improving this morning.  Epigastric pain/burning -CAT scan noted cholelithiasis but no cholecystitis. -Probably has underlying acid peptic disease.  Start on Protonix and as needed Tums.  Mobility: Encourage ambulation Code Status:   Code Status: Full Code  Nutritional status: Body mass index is 31.32 kg/m.     Diet:  Diet Order             Diet regular Room service appropriate? Yes; Fluid consistency: Thin  Diet effective now                  DVT prophylaxis:  enoxaparin (LOVENOX) injection 40 mg Start: 11/19/20 1000   Antimicrobials: IV Rocephin Fluid: None Consultants: Plastic surgery Family Communication: None at bedside   Status is: Observation  Remains inpatient appropriate because: Needs IV antibiotics  Dispo: The patient is from: Home              Anticipated d/c is to: Home in 2 to 3 days              Patient currently is not medically stable to d/c.   Difficult to place patient No     Infusions:   cefTRIAXone (ROCEPHIN)  IV 1 g (11/20/20 [id number])    Scheduled Meds:  doxycycline  100 mg Oral Q12H   enoxaparin (LOVENOX) injection  40 mg Subcutaneous Q24H   pantoprazole  40 mg Oral Daily   potassium chloride  40 mEq Oral Once   sodium  chloride flush  3 mL Intravenous Q12H    Antimicrobials: Anti-infectives (From admission, onward)    Start     Dose/Rate Route Frequency Ordered Stop   11/20/20 1230  doxycycline (VIBRA-TABS) tablet 100 mg        100 mg Oral Every 12 hours 11/20/20 1132     11/19/20 0600  cefTRIAXone (ROCEPHIN) 1 g in sodium chloride 0.9 % 100 mL IVPB        1 g 200 mL/hr over 30 Minutes Intravenous Every 24 hours 11/18/20 2352 11/26/20 0559   11/18/20 2230  vancomycin (VANCOREADY) IVPB 2000 mg/400 mL        2,000 mg 200 mL/hr over 120 Minutes Intravenous Once 11/18/20 2217 11/19/20 0240   11/18/20 2215  vancomycin (VANCOCIN) 2,000 mg in sodium chloride 0.9 % 500 mL IVPB  Status:  Discontinued        2,000 mg 250 mL/hr over 120  Minutes Intravenous  Once 11/18/20 2201 11/18/20 2217   11/18/20 2215  piperacillin-tazobactam (ZOSYN) IVPB 3.375 g        3.375 g 12.5 mL/hr over 240 Minutes Intravenous Once 11/18/20 2201 11/19/20 0115       PRN meds: acetaminophen **OR** acetaminophen, calcium carbonate, HYDROcodone-acetaminophen, polyethylene glycol   Objective: Vitals:   11/20/20 0400 11/20/20 1003  BP: 104/79 114/68  Pulse: 83 96  Resp: 16 18  Temp: 98.7 F (37.1 C) 98.2 F (36.8 C)  SpO2: 98% 100%    Intake/Output Summary (Last 24 hours) at 11/20/2020 1133 Last data filed at 11/20/2020 0000 Gross per 24 hour  Intake 102.2 ml  Output --  Net 102.2 ml   Filed Weights   11/19/20 0244  Weight: 90.7 kg   Weight change:  Body mass index is 31.32 kg/m.   Physical Exam: General exam: Pleasant, middle-aged African-American female.  Not in distress at rest Skin: No rashes, lesions or ulcers. HEENT: Atraumatic, normocephalic, no obvious bleeding Lungs: Clear to auscultation bilaterally CVS: Regular rate and rhythm, no murmur GI/Abd soft, mild epigastric tenderness.  Abdominal binder in place, soaked at places because of oozing.  CNS: Alert, awake, oriented x3 Psychiatry: Mood appropriate Extremities: No pedal edema, no calf tenderness  Data Review: I have personally reviewed the laboratory data and studies available.  Recent Labs  Lab 11/18/20 1345 11/20/20 0843  WBC 10.6* 8.6  NEUTROABS 7.5  --   HGB 11.7* 10.7*  HCT 34.9* 31.5*  MCV 87.0 85.6  PLT 420* 384   Recent Labs  Lab 11/18/20 1345 11/18/20 2348 11/20/20 0843  NA 137  --  137  K 3.3*  --  3.3*  CL 101  --  103  CO2 27  --  24  GLUCOSE 89  --  108*  BUN 10  --  <5*  CREATININE 0.69  --  0.68  CALCIUM 9.4  --  9.0  MG  --  1.7  --     F/u labs ordered Unresulted Labs (From admission, onward)     Start     Ordered   11/25/20 0500  Creatinine, serum  (enoxaparin (LOVENOX)    CrCl >/= 30 ml/min)  Weekly,   R      Comments: while on enoxaparin therapy    11/18/20 2352   11/18/20 2140  Blood culture (routine x 2)  BLOOD CULTURE X 2,   STAT      11/18/20 2139            Signed, Terrilee Croak, MD  [facility] 11/20/2020

## 2020-11-20 NOTE — Plan of Care (Signed)

## 2020-11-21 NOTE — Plan of Care (Signed)
  Problem: Health Behavior/Discharge Planning: Goal: Ability to manage health-related needs will improve Outcome: Progressing   Problem: Clinical Measurements: Goal: Ability to maintain clinical measurements within normal limits will improve Outcome: Progressing Goal: Will remain free from infection Outcome: Progressing   Problem: Activity: Goal: Risk for activity intolerance will decrease Outcome: Progressing   Problem: Nutrition: Goal: Adequate nutrition will be maintained Outcome: Progressing   

## 2020-11-21 NOTE — Progress Notes (Signed)
PROGRESS NOTE  Jennifer Cardenas  DOB: 08/15/1975  PCP: Dorothyann Peng, NP [id number]  DOA: 11/18/2020  LOS: 2 days  Hospital Day: 4   Chief Complaint  Patient presents with   Wound Check    Brief narrative: Jennifer Cardenas is a 45 y.o. female with PMH significant for constipation, migraine, arthritis, anxiety, HSV who underwent a tummy tuck procedure done in [place] 3 weeks ago.   Patient presented to the ED on 8/25 with tachycardia and right upper quadrant pain. She had a procedure done on 8/5 and completed 1 week of antibiotics as advised despite which, she noted soreness at her incision site which started gaping as well.    In the ED, patient was tachycardic to 120s, WBC count elevated to 10.6, lactic acid normal. CT of the abdomen pelvis showed cellulitis around the incision site with no focal collection.   Admitted to hospital service for further evaluation management   Subjective: Patient was seen and examined this morning.   Lying on bed.  Not in distress.  Continues to have pain and wheezing under abdominal wound.  No fever.  White count normal. Epigastric pain improved after Protonix was started.  Assessment/Plan: Surgical site infection -Presented with surgical incision infection at the site of tummy tuck procedure done 3 weeks ago  -Plastic surgery consultation obtained with Dr. Marla Roe.  No need of surgical intervention at this time. -Currently remains on IV Rocephin.  I will continue her on IV antibiotics next 24 to 48 hours and hopefully discharge on oral.  On 8/87, IV had an informal discussion with ID Dr. Gerri Spore.  Oral doxycycline was added for MRSA coverage.  Probably does not need IV antibiotics at discharge. -Plastic surgery to follow-up tomorrow Monday. Recent Labs  Lab 11/18/20 1345 11/18/20 2348 11/20/20 0843  WBC 10.6*  --  8.6  LATICACIDVEN  --  1.2  --    Hypokalemia -Potassium level was low and was replaced. Recent Labs  Lab 11/18/20 1345  11/18/20 2348 11/20/20 0843  K 3.3*  --  3.3*  MG  --  1.7  --    Sinus tachycardia -Patient states that she usually runs tachycardic because of PACs and PVCs.  -Sinus tachycardia improving this morning.  Epigastric pain/burning -CAT scan noted cholelithiasis but no cholecystitis. -Probably has underlying acid peptic disease.  Start on Protonix and as needed Tums.  Mobility: Encourage ambulation Code Status:   Code Status: Full Code  Nutritional status: Body mass index is 31.32 kg/m.     Diet:  Diet Order             Diet regular Room service appropriate? Yes; Fluid consistency: Thin  Diet effective now                  DVT prophylaxis:  enoxaparin (LOVENOX) injection 40 mg Start: 11/19/20 1000   Antimicrobials: IV Rocephin Fluid: None Consultants: Plastic surgery Family Communication: None at bedside   Status is: Observation  Remains inpatient appropriate because: Needs IV antibiotics  Dispo: The patient is from: Home              Anticipated d/c is to: Home in 1 to 2 days              Patient currently is not medically stable to d/c.   Difficult to place patient No     Infusions:   cefTRIAXone (ROCEPHIN)  IV 1 g (11/21/20 [id number])    Scheduled Meds:  doxycycline  100 mg Oral Q12H   enoxaparin (LOVENOX) injection  40 mg Subcutaneous Q24H   pantoprazole  40 mg Oral Daily   sodium chloride flush  3 mL Intravenous Q12H    Antimicrobials: Anti-infectives (From admission, onward)    Start     Dose/Rate Route Frequency Ordered Stop   11/20/20 1230  doxycycline (VIBRA-TABS) tablet 100 mg        100 mg Oral Every 12 hours 11/20/20 1132     11/19/20 0600  cefTRIAXone (ROCEPHIN) 1 g in sodium chloride 0.9 % 100 mL IVPB        1 g 200 mL/hr over 30 Minutes Intravenous Every 24 hours 11/18/20 2352 11/26/20 0559   11/18/20 2230  vancomycin (VANCOREADY) IVPB 2000 mg/400 mL        2,000 mg 200 mL/hr over 120 Minutes Intravenous Once 11/18/20 2217 11/19/20  0240   11/18/20 2215  vancomycin (VANCOCIN) 2,000 mg in sodium chloride 0.9 % 500 mL IVPB  Status:  Discontinued        2,000 mg 250 mL/hr over 120 Minutes Intravenous  Once 11/18/20 2201 11/18/20 2217   11/18/20 2215  piperacillin-tazobactam (ZOSYN) IVPB 3.375 g        3.375 g 12.5 mL/hr over 240 Minutes Intravenous Once 11/18/20 2201 11/19/20 0115       PRN meds: acetaminophen **OR** acetaminophen, calcium carbonate, HYDROcodone-acetaminophen, polyethylene glycol   Objective: Vitals:   11/21/20 0634 11/21/20 0925  BP: 113/77 116/80  Pulse: 95 98  Resp: 18 18  Temp: 99 F (37.2 C) 97.7 F (36.5 C)  SpO2: 98% 100%    Intake/Output Summary (Last 24 hours) at 11/21/2020 1149 Last data filed at 11/21/2020 0900 Gross per 24 hour  Intake 960 ml  Output 0 ml  Net 960 ml   Filed Weights   11/19/20 0244  Weight: 90.7 kg   Weight change:  Body mass index is 31.32 kg/m.   Physical Exam: General exam: Pleasant, middle-aged African-American female.  Not in distress at this time Skin: No rashes, lesions or ulcers. HEENT: Atraumatic, normocephalic, no obvious bleeding Lungs: Clear to auscultation bilaterally CVS: Regular rate and rhythm, no murmur GI/Abd soft, mild epigastric tenderness.  Abdominal binder in place. CNS: Alert, awake, oriented x3 Psychiatry: Mood appropriate Extremities: No pedal edema, no calf tenderness  Data Review: I have personally reviewed the laboratory data and studies available.  Recent Labs  Lab 11/18/20 1345 11/20/20 0843  WBC 10.6* 8.6  NEUTROABS 7.5  --   HGB 11.7* 10.7*  HCT 34.9* 31.5*  MCV 87.0 85.6  PLT 420* 384   Recent Labs  Lab 11/18/20 1345 11/18/20 2348 11/20/20 0843  NA 137  --  137  K 3.3*  --  3.3*  CL 101  --  103  CO2 27  --  24  GLUCOSE 89  --  108*  BUN 10  --  <5*  CREATININE 0.69  --  0.68  CALCIUM 9.4  --  9.0  MG  --  1.7  --     F/u labs ordered Unresulted Labs (From admission, onward)     Start      Ordered   11/25/20 0500  Creatinine, serum  (enoxaparin (LOVENOX)    CrCl >/= 30 ml/min)  Weekly,   R     Comments: while on enoxaparin therapy    11/18/20 2352   11/22/20 0500  CBC with Differential/Platelet  Daily,   R     Question:  Specimen  collection method  Answer:  Lab=Lab collect   11/21/20 1149   11/22/20 [id number]  Basic metabolic panel  Daily,   R     Question:  Specimen collection method  Answer:  Lab=Lab collect   11/21/20 1149            Signed, Terrilee Croak, MD [facility] 11/21/2020

## 2020-11-22 ENCOUNTER — Other Ambulatory Visit (HOSPITAL_COMMUNITY): Payer: Self-pay

## 2020-11-22 LAB — BASIC METABOLIC PANEL WITH GFR
Anion gap: 9 (ref 5–15)
BUN: 10 mg/dL (ref 6–20)
CO2: 24 mmol/L (ref 22–32)
Calcium: 9.5 mg/dL (ref 8.9–10.3)
Chloride: 101 mmol/L (ref 98–111)
Creatinine, Ser: 0.66 mg/dL (ref 0.44–1.00)
GFR, Estimated: >60 mL/min
Glucose, Bld: 101 mg/dL — ABNORMAL HIGH (ref 70–99)
Potassium: 4 mmol/L (ref 3.5–5.1)
Sodium: 134 mmol/L — ABNORMAL LOW (ref 135–145)

## 2020-11-22 LAB — CBC WITH DIFFERENTIAL/PLATELET
Abs Immature Granulocytes: 0.03 10*3/uL (ref 0.00–0.07)
Basophils Absolute: 0 10*3/uL (ref 0.0–0.1)
Basophils Relative: 1 %
Eosinophils Absolute: 0.2 10*3/uL (ref 0.0–0.5)
Eosinophils Relative: 2 %
HCT: 31.4 % — ABNORMAL LOW (ref 36.0–46.0)
Hemoglobin: 10.7 g/dL — ABNORMAL LOW (ref 12.0–15.0)
Immature Granulocytes: 0 %
Lymphocytes Relative: 26 %
Lymphs Abs: 2.2 10*3/uL (ref 0.7–4.0)
MCH: 29.2 pg (ref 26.0–34.0)
MCHC: 34.1 g/dL (ref 30.0–36.0)
MCV: 85.6 fL (ref 80.0–100.0)
Monocytes Absolute: 1.1 10*3/uL — ABNORMAL HIGH (ref 0.1–1.0)
Monocytes Relative: 13 %
Neutro Abs: 4.8 10*3/uL (ref 1.7–7.7)
Neutrophils Relative %: 58 %
Platelets: 402 10*3/uL — ABNORMAL HIGH (ref 150–400)
RBC: 3.67 MIL/uL — ABNORMAL LOW (ref 3.87–5.11)
RDW: 13.8 % (ref 11.5–15.5)
WBC: 8.3 10*3/uL (ref 4.0–10.5)
nRBC: 0 % (ref 0.0–0.2)

## 2020-11-22 MED ORDER — SACCHAROMYCES BOULARDII 250 MG PO CAPS
250.0000 mg | ORAL_CAPSULE | Freq: Two times a day (BID) | ORAL | 0 refills | Status: AC
  Filled 2020-11-22: qty 14, 7d supply, fill #0

## 2020-11-22 MED ORDER — CEPHALEXIN 500 MG PO CAPS
500.0000 mg | ORAL_CAPSULE | Freq: Four times a day (QID) | ORAL | 0 refills | Status: AC
  Filled 2020-11-22: qty 28, 7d supply, fill #0

## 2020-11-22 MED ORDER — DOXYCYCLINE HYCLATE 100 MG PO TABS
100.0000 mg | ORAL_TABLET | Freq: Two times a day (BID) | ORAL | 0 refills | Status: AC
  Filled 2020-11-22: qty 14, 7d supply, fill #0

## 2020-11-22 MED ORDER — PANTOPRAZOLE SODIUM 40 MG PO TBEC
40.0000 mg | DELAYED_RELEASE_TABLET | Freq: Every day | ORAL | 0 refills | Status: AC
  Filled 2020-11-22: qty 30, 30d supply, fill #0

## 2020-11-22 NOTE — Progress Notes (Signed)
DISCHARGE NOTE HOME Jennifer Cardenas to be discharged Home per MD order. Discussed prescriptions and follow up appointments with the patient. Prescriptions given to patient; medication list explained in detail. Patient verbalized understanding.  Skin clean, dry and intact without evidence of skin break down, no evidence of skin tears noted. IV catheter discontinued intact. Site without signs and symptoms of complications. Dressing and pressure applied. Pt denies pain at the site currently. No complaints noted.  Patient free of lines, drains, and wounds.   An After Visit Summary (AVS) was printed and given to the patient. Patient escorted via wheelchair, and discharged home via private auto.  Arlyss Repress, RN

## 2020-11-22 NOTE — Discharge Summary (Signed)
Physician Discharge Summary  Fantasha Shroff [facility] [id number] DOB: [birth year] DOA: 11/18/2020  PCP: Dorothyann Peng, NP  Admit date: 11/18/2020 Discharge date: 11/22/2020  Admitted From: Home Discharge disposition: Home   Code Status: Full Code   Discharge Diagnosis:   Principal Problem:   Cellulitis Active Problems:   Hypokalemia   External incisional dehiscence    Chief Complaint  Patient presents with   Wound Check    Brief narrative: Jennifer Cardenas is a 45 y.o. female with PMH significant for constipation, migraine, arthritis, anxiety, HSV who underwent a tummy tuck procedure done in [place] 3 weeks ago.   Patient presented to the ED on 8/25 with tachycardia and right upper quadrant pain. She had a procedure done on 8/5 and completed 1 week of antibiotics as advised despite which, she noted soreness at her incision site which started gaping as well.    In the ED, patient was tachycardic to 120s, WBC count elevated to 10.6, lactic acid normal. CT of the abdomen pelvis showed cellulitis around the incision site with no focal collection.   Admitted to hospital service for further evaluation management   Subjective: Patient was seen and examined this morning.   Sitting up in bed.  Feels better.  Not requiring pain medicine.  Continues to have slight wheezing from the wound site.  WBC count normal.  White count normal. Epigastric pain improved after Protonix was started.  Hospital course Surgical site infection -Presented with surgical incision infection at the site of tummy tuck procedure done 3 weeks ago at [facility].  Patient was started on IV Rocephin and doxycycline. -Plastic surgery consultation obtained with Dr. Marla Roe.  No need of surgical intervention at this time.  White count normal.  No fever.  Per discussion with plastic surgery and ID, we plan to discharge the patient home today on oral Keflex and oral doxycycline for next 1 week with probiotics. -Recommended to  continue daily Xeroform changes in the interim. -To follow-up with her surgeon at [facility]. Recent Labs  Lab 11/18/20 1345 11/18/20 2348 11/20/20 0843 11/22/20 0353  WBC 10.6*  --  8.6 8.3  LATICACIDVEN  --  1.2  --   --    Hypokalemia -Potassium level was low and was replaced. Recent Labs  Lab 11/18/20 1345 11/18/20 2348 11/20/20 0843 11/22/20 0353  K 3.3*  --  3.3* 4.0  MG  --  1.7  --   --    Sinus tachycardia -Patient states that she usually runs tachycardic because of PACs and PVCs.  -Sinus tachycardia improving   Epigastric pain/burning -CAT scan noted cholelithiasis but no cholecystitis. -Probably has underlying acid peptic disease.  Start on Protonix and as needed Tums.   Allergies as of 11/22/2020       Reactions   Benzoyl Peroxide Swelling   For acne        Medication List     STOP taking these medications    albuterol 108 (90 Base) MCG/ACT inhaler Commonly known as: VENTOLIN HFA       TAKE these medications    cephALEXin 500 MG capsule Commonly known as: KEFLEX Take 1 capsule (500 mg total) by mouth 4 (four) times daily for 7 days.   doxycycline 100 MG tablet Commonly known as: VIBRA-TABS Take 1 tablet (100 mg total) by mouth every 12 (twelve) hours for 7 days.   IMMUNE SUPPORT PO Take 1 tablet by mouth every evening. Vitamin C, Zinc, Elderberry & Ginger   IRON PO Take  1 tablet by mouth every evening.   pantoprazole 40 MG tablet Commonly known as: PROTONIX Take 1 tablet (40 mg total) by mouth daily. Start taking on: November 23, 2020   saccharomyces boulardii 250 MG capsule Commonly known as: FLORASTOR Take 1 capsule (250 mg total) by mouth 2 (two) times daily for 7 days.   Spacer/Aero-Holding [employer] Use with Ventolin to Inhale 2 puffs into the lungs every 6 hours PRN.   valACYclovir 1000 MG tablet Commonly known as: VALTREX Take 2 tablets BID for one day as needed   VITAMIN B COMPLEX PO Take 1 tablet by mouth every  evening.               Discharge Care Instructions  (From admission, onward)           Start     Ordered   11/22/20 0000  Discharge wound care:       Comments: Change Xerofoam dressing daily till next visit with surgeon.   11/22/20 1202            Discharge Instructions:  Diet Recommendation:  Discharge Diet Orders (From admission, onward)     Start     Ordered   11/22/20 0000  Diet general        11/22/20 1202              Follow with Primary MD Dorothyann Peng, NP in 7 days   Get CBC/BMP checked in next visit within 1 week by PCP or SNF MD ( we routinely change or add medications that can affect your baseline labs and fluid status, therefore we recommend that you get the mentioned basic workup next visit with your PCP, your PCP may decide not to get them or add new tests based on their clinical decision)  On your next visit with your PCP, please Get Medicines reviewed and adjusted.  Please request your PCP  to go over all Hospital Tests and Procedure/Radiological results at the follow up, please get all Hospital records sent to your Prim MD by signing hospital release before you go home.  Activity: As tolerated with Full fall precautions use walker/cane & assistance as needed  For Heart failure patients - Check your Weight same time everyday, if you gain over 2 pounds, or you develop in leg swelling, experience more shortness of breath or chest pain, call your Primary MD immediately. Follow Cardiac Low Salt Diet and 1.5 lit/day fluid restriction.  If you have smoked or chewed Tobacco in the last 2 yrs please stop smoking, stop any regular Alcohol  and or any Recreational drug use.  If you experience worsening of your admission symptoms, develop shortness of breath, life threatening emergency, suicidal or homicidal thoughts you must seek medical attention immediately by calling 911 or calling your MD immediately  if symptoms less severe.  You Must read  complete instructions/literature along with all the possible adverse reactions/side effects for all the Medicines you take and that have been prescribed to you. Take any new Medicines after you have completely understood and accpet all the possible adverse reactions/side effects.   Do not drive, operate heavy machinery, perform activities at heights, swimming or participation in water activities or provide baby sitting services if your were admitted for syncope or siezures until you have seen by Primary MD or a Neurologist and advised to do so again.  Do not drive when taking Pain medications.  Do not take more than prescribed Pain, Sleep and Anxiety Medications  Wear Seat belts while driving.   Please note You were cared for by a hospitalist during your hospital stay. If you have any questions about your discharge medications or the care you received while you were in the hospital after you are discharged, you can call the unit and asked to speak with the hospitalist on call if the hospitalist that took care of you is not available. Once you are discharged, your primary care physician will handle any further medical issues. Please note that NO REFILLS for any discharge medications will be authorized once you are discharged, as it is imperative that you return to your primary care physician (or establish a relationship with a primary care physician if you do not have one) for your aftercare needs so that they can reassess your need for medications and monitor your lab values.    Follow ups:    Follow-up Information     Dorothyann Peng, NP .   Specialty: Family Medicine Contact information: [place] [address] [phone]         Dorothyann Peng, NP Follow up.   Specialty: Family Medicine Contact information: [address] [place] [address] [phone]                 Wound care:   Wound / Incision (Open or Dehisced) 11/19/20 Incision  - Dehisced Pelvis Anterior;Right;Left;Medial surgical wound (Active)  Date First Assessed/Time First Assessed: 11/19/20 1901   Wound Type: Incision - Dehisced  Location: Pelvis  Location Orientation: Anterior;Right;Left;Medial  Wound Description (Comments): surgical wound  Present on Admission: Yes    Assessments 11/19/2020  9:00 PM 11/21/2020  9:47 PM  Dressing Type Abdominal binder Abdominal binder;Other (Comment);Abdominal pads  Dressing Changed Changed --  Dressing Status Intact Clean;Dry;Intact  Dressing Change Frequency -- Daily  Site / Wound Assessment -- Pink;Red  Margins -- Unattached edges (unapproximated)     No Linked orders to display     Wound / Incision (Open or Dehisced) 11/19/20 Incision - Dehisced Pelvis Mid surgical wound (Active)  Date First Assessed/Time First Assessed: 11/19/20 1901   Wound Type: Incision - Dehisced  Location: Pelvis  Location Orientation: Mid  Wound Description (Comments): surgical wound  Present on Admission: Yes    Assessments 11/20/2020  1:50 AM 11/21/2020  9:47 PM  Dressing Type -- Abdominal binder;Abdominal pads;Other (Comment)  Dressing Changed Changed --  Dressing Status Intact Clean;Dry;Intact  Dressing Change Frequency -- Daily  Site / Wound Assessment Clean;Pink Pink;Red  Margins Unattached edges (unapproximated) Unattached edges (unapproximated)  Drainage Amount Scant --  Drainage Description Serosanguineous --  Treatment Cleansed --     No Linked orders to display    Discharge Exam:   Vitals:   11/21/20 [id number] 11/21/20 2136 11/22/20 0543 11/22/20 0940  BP: 119/80 118/87 118/82 121/68  Pulse: 94 86 83 (!) 107  Resp: '18 14 18 18  '$ Temp: 98.1 F (36.7 C) 98.4 F (36.9 C) 98.6 F (37 C) 98.1 F (36.7 C)  TempSrc: Oral Oral Oral Oral  SpO2: 100% 100% 100% 100%  Weight:      Height:        Body mass index is 31.32 kg/m.  General exam: Pleasant, middle-aged African-American female.  Not requiring pain medicine Skin: No rashes,  lesions or ulcers. HEENT: Atraumatic, normocephalic, no obvious bleeding Lungs: Clear to auscultation bilaterally CVS: Regular rate and rhythm, no murmur GI/Abd soft, nontender, abdominal binder in place. CNS: Alert, awake, oriented x3 Psychiatry: Mood appropriate Extremities:  No pedal edema, no calf tenderness  Time coordinating discharge: 35 minutes   The results of significant diagnostics from this hospitalization (including imaging, microbiology, ancillary and laboratory) are listed below for reference.    Procedures and Diagnostic Studies:   CT Angio Chest PE W and/or Wo Contrast  Result Date: 11/18/2020 CLINICAL DATA:  History of tummy tuck 3 weeks ago with persistent pain and swelling in the surgical site as well as new right upper quadrant and chest pain. EXAM: CT ANGIOGRAPHY CHEST CT ABDOMEN AND PELVIS WITH CONTRAST TECHNIQUE: Multidetector CT imaging of the chest was performed using the standard protocol during bolus administration of intravenous contrast. Multiplanar CT image reconstructions and MIPs were obtained to evaluate the vascular anatomy. Multidetector CT imaging of the abdomen and pelvis was performed using the standard protocol during bolus administration of intravenous contrast. CONTRAST:  120m OMNIPAQUE IOHEXOL 350 MG/ML SOLN COMPARISON:  Ultrasound from earlier in the same day. FINDINGS: CTA CHEST FINDINGS Cardiovascular: Thoracic aorta and its branches demonstrate a normal branching pattern. No aneurysmal dilatation or dissection is noted. No cardiac enlargement is seen. No pericardial effusion is noted. No coronary calcifications are seen. The pulmonary artery shows a normal branching pattern without definitive filling defect to suggest pulmonary embolism. Mediastinum/Nodes: Thoracic inlet is within normal limits. No sizable hilar or mediastinal adenopathy is noted. The esophagus as visualized is within normal limits. Lungs/Pleura: Mild dependent atelectatic changes are  noted in the right lower lobe. No focal infiltrate or effusion is seen. Musculoskeletal: No chest wall abnormality. No acute or significant osseous findings. Review of the MIP images confirms the above findings. CT ABDOMEN and PELVIS FINDINGS Hepatobiliary: Liver is within normal limits. Changes consistent with cholelithiasis are seen. No biliary ductal dilatation is noted. Pancreas: Unremarkable. No pancreatic ductal dilatation or surrounding inflammatory changes. Spleen: Normal in size without focal abnormality. Adrenals/Urinary Tract: Adrenal glands are within normal limits. The kidneys demonstrate early excretion of contrast material. No obstructive changes are noted. The bladder is partially distended. Stomach/Bowel: Scattered fecal material is noted throughout the colon without obstructive change or significant constipation. The appendix is within normal limits. No small bowel or gastric abnormality is seen. Vascular/Lymphatic: No significant vascular findings are present. No enlarged abdominal or pelvic lymph nodes. Reproductive: Uterus and bilateral adnexa are unremarkable. Other: No free fluid is noted. There are changes in the anterior abdominal wall with subcutaneous edema consistent with the history of recent tummy tuck procedure. No focal fluid collection to suggest abscess formation is noted. Mild skin thickening is noted consistent with a degree of cellulitis. Musculoskeletal: No acute or significant osseous findings. Review of the MIP images confirms the above findings. IMPRESSION: CTA of the chest: No definitive pulmonary emboli are seen. Mild right basilar atelectasis. CT of the abdomen and pelvis: Cholelithiasis without complicating factors. Mild retained fecal material without constipation. Changes consistent with the given clinical history of recent tummy tuck procedure with subcutaneous edema and mild skin thickening likely representing cellulitis. No focal abscess is noted. Electronically  Signed   By: MInez CatalinaM.D.   On: 11/18/2020 21:34   CT ABDOMEN PELVIS W CONTRAST  Result Date: 11/18/2020 CLINICAL DATA:  History of tummy tuck 3 weeks ago with persistent pain and swelling in the surgical site as well as new right upper quadrant and chest pain. EXAM: CT ANGIOGRAPHY CHEST CT ABDOMEN AND PELVIS WITH CONTRAST TECHNIQUE: Multidetector CT imaging of the chest was performed using the standard protocol during bolus administration of intravenous contrast. Multiplanar  CT image reconstructions and MIPs were obtained to evaluate the vascular anatomy. Multidetector CT imaging of the abdomen and pelvis was performed using the standard protocol during bolus administration of intravenous contrast. CONTRAST:  147m OMNIPAQUE IOHEXOL 350 MG/ML SOLN COMPARISON:  Ultrasound from earlier in the same day. FINDINGS: CTA CHEST FINDINGS Cardiovascular: Thoracic aorta and its branches demonstrate a normal branching pattern. No aneurysmal dilatation or dissection is noted. No cardiac enlargement is seen. No pericardial effusion is noted. No coronary calcifications are seen. The pulmonary artery shows a normal branching pattern without definitive filling defect to suggest pulmonary embolism. Mediastinum/Nodes: Thoracic inlet is within normal limits. No sizable hilar or mediastinal adenopathy is noted. The esophagus as visualized is within normal limits. Lungs/Pleura: Mild dependent atelectatic changes are noted in the right lower lobe. No focal infiltrate or effusion is seen. Musculoskeletal: No chest wall abnormality. No acute or significant osseous findings. Review of the MIP images confirms the above findings. CT ABDOMEN and PELVIS FINDINGS Hepatobiliary: Liver is within normal limits. Changes consistent with cholelithiasis are seen. No biliary ductal dilatation is noted. Pancreas: Unremarkable. No pancreatic ductal dilatation or surrounding inflammatory changes. Spleen: Normal in size without focal abnormality.  Adrenals/Urinary Tract: Adrenal glands are within normal limits. The kidneys demonstrate early excretion of contrast material. No obstructive changes are noted. The bladder is partially distended. Stomach/Bowel: Scattered fecal material is noted throughout the colon without obstructive change or significant constipation. The appendix is within normal limits. No small bowel or gastric abnormality is seen. Vascular/Lymphatic: No significant vascular findings are present. No enlarged abdominal or pelvic lymph nodes. Reproductive: Uterus and bilateral adnexa are unremarkable. Other: No free fluid is noted. There are changes in the anterior abdominal wall with subcutaneous edema consistent with the history of recent tummy tuck procedure. No focal fluid collection to suggest abscess formation is noted. Mild skin thickening is noted consistent with a degree of cellulitis. Musculoskeletal: No acute or significant osseous findings. Review of the MIP images confirms the above findings. IMPRESSION: CTA of the chest: No definitive pulmonary emboli are seen. Mild right basilar atelectasis. CT of the abdomen and pelvis: Cholelithiasis without complicating factors. Mild retained fecal material without constipation. Changes consistent with the given clinical history of recent tummy tuck procedure with subcutaneous edema and mild skin thickening likely representing cellulitis. No focal abscess is noted. Electronically Signed   By: MInez CatalinaM.D.   On: 11/18/2020 21:34   UKoreaAbdomen Limited RUQ (LIVER/GB)  Result Date: 11/18/2020 CLINICAL DATA:  Right upper quadrant pain.  Post tummy tuck. EXAM: ULTRASOUND ABDOMEN LIMITED RIGHT UPPER QUADRANT COMPARISON:  None. FINDINGS: Gallbladder: Multiple stones and sludge in the gallbladder fundus. Largest stone measures 1.8 cm. No wall thickening or edema. Murphy's sign is negative. Common bile duct: Diameter: 4 mm, normal Liver: No focal lesion identified. Within normal limits in  parenchymal echogenicity. Portal vein is patent on color Doppler imaging with normal direction of blood flow towards the liver. Other: None. IMPRESSION: Cholelithiasis without evidence of acute cholecystitis. Electronically Signed   By: WLucienne CapersM.D.   On: 11/18/2020 18:59     Labs:   Basic Metabolic Panel: Recent Labs  Lab 11/18/20 1345 11/18/20 2348 11/20/20 0843 11/22/20 0353  NA 137  --  137 134*  K 3.3*  --  3.3* 4.0  CL 101  --  103 101  CO2 27  --  24 24  GLUCOSE 89  --  108* 101*  BUN 10  --  <5*  10  CREATININE 0.69  --  0.68 0.66  CALCIUM 9.4  --  9.0 9.5  MG  --  1.7  --   --    GFR Estimated Creatinine Clearance: 100.5 mL/min (by C-G formula based on SCr of 0.66 mg/dL). Liver Function Tests: Recent Labs  Lab 11/18/20 1345  AST 18  ALT 11  ALKPHOS 45  BILITOT 0.6  PROT 7.8  ALBUMIN 3.5   Recent Labs  Lab 11/18/20 1345  LIPASE 28   No results for input(s): AMMONIA in the last 168 hours. Coagulation profile No results for input(s): INR, PROTIME in the last 168 hours.  CBC: Recent Labs  Lab 11/18/20 1345 11/20/20 0843 11/22/20 0353  WBC 10.6* 8.6 8.3  NEUTROABS 7.5  --  4.8  HGB 11.7* 10.7* 10.7*  HCT 34.9* 31.5* 31.4*  MCV 87.0 85.6 85.6  PLT 420* 384 402*   Cardiac Enzymes: No results for input(s): CKTOTAL, CKMB, CKMBINDEX, TROPONINI in the last 168 hours. BNP: Invalid input(s): POCBNP CBG: No results for input(s): GLUCAP in the last 168 hours. D-Dimer No results for input(s): DDIMER in the last 72 hours. Hgb A1c No results for input(s): HGBA1C in the last 72 hours. Lipid Profile No results for input(s): CHOL, HDL, LDLCALC, TRIG, CHOLHDL, LDLDIRECT in the last 72 hours. Thyroid function studies No results for input(s): TSH, T4TOTAL, T3FREE, THYROIDAB in the last 72 hours.  Invalid input(s): FREET3 Anemia work up No results for input(s): VITAMINB12, FOLATE, FERRITIN, TIBC, IRON, RETICCTPCT in the last 72  hours. Microbiology Recent Results (from the past 240 hour(s))  Blood culture (routine x 2)     Status: None (Preliminary result)   Collection Time: 11/18/20 11:40 PM   Specimen: BLOOD  Result Value Ref Range Status   Specimen Description BLOOD SITE NOT SPECIFIED  Final   Special Requests   Final    BOTTLES DRAWN AEROBIC AND ANAEROBIC Blood Culture results may not be optimal due to an inadequate volume of blood received in culture bottles   Culture   Final    NO GROWTH 3 DAYS Performed at [facility], [place] [address], [place], [place] [address]    Report Status PENDING  Incomplete  Resp Panel by RT-PCR (Flu A&B, Covid) Nasopharyngeal Swab     Status: None   Collection Time: 11/19/20  6:13 PM   Specimen: Nasopharyngeal Swab; Nasopharyngeal(NP) swabs in vial transport medium  Result Value Ref Range Status   SARS Coronavirus 2 by RT PCR NEGATIVE NEGATIVE Final    Comment: (NOTE) SARS-CoV-2 target nucleic acids are NOT DETECTED.  The SARS-CoV-2 RNA is generally detectable in upper respiratory specimens during the acute phase of infection. The lowest concentration of SARS-CoV-2 viral copies this assay can detect is 138 copies/mL. A negative result does not preclude SARS-Cov-2 infection and should not be used as the sole basis for treatment or other patient management decisions. A negative result may occur with  improper specimen collection/handling, submission of specimen other than nasopharyngeal swab, presence of viral mutation(s) within the areas targeted by this assay, and inadequate number of viral copies(<138 copies/mL). A negative result must be combined with clinical observations, patient history, and epidemiological information. The expected result is Negative.  Fact Sheet for Patients:  EntrepreneurPulse.com.au  Fact Sheet for Healthcare Providers:  IncredibleEmployment.be  This test is no t yet approved or cleared by the [place] FDA and  has been authorized for detection and/or diagnosis of SARS-CoV-2 by FDA under an Emergency  Use Authorization (EUA). This EUA will remain  in effect (meaning this test can be used) for the duration of the COVID-19 declaration under Section 564(b)(1) of the Act, 21 U.S.C.section 360bbb-3(b)(1), unless the authorization is terminated  or revoked sooner.       Influenza A by PCR NEGATIVE NEGATIVE Final   Influenza B by PCR NEGATIVE NEGATIVE Final    Comment: (NOTE) The Xpert Xpress SARS-CoV-2/FLU/RSV plus assay is intended as an aid in the diagnosis of influenza from Nasopharyngeal swab specimens and should not be used as a sole basis for treatment. Nasal washings and aspirates are unacceptable for Xpert Xpress SARS-CoV-2/FLU/RSV testing.  Fact Sheet for Patients: EntrepreneurPulse.com.au  Fact Sheet for Healthcare Providers: IncredibleEmployment.be  This test is not yet approved or cleared by the [place] FDA and has been authorized for detection and/or diagnosis of SARS-CoV-2 by FDA under an Emergency Use Authorization (EUA). This EUA will remain in effect (meaning this test can be used) for the duration of the COVID-19 declaration under Section 564(b)(1) of the Act, 21 U.S.C. section 360bbb-3(b)(1), unless the authorization is terminated or revoked.  Performed at [facility], [place] [address] [place],  [address]      Signed: Terrilee Croak  [facility] 11/22/2020, 12:03 PM

## 2020-11-24 ENCOUNTER — Telehealth: Payer: Self-pay

## 2020-11-24 LAB — CULTURE, BLOOD (ROUTINE X 2): Culture: NO GROWTH

## 2020-11-24 NOTE — Telephone Encounter (Signed)
Transition Care Management Unsuccessful Follow-up Telephone Call  Date of discharge and from where:  11/22/2020  [facility]   Attempts:  1st Attempt  Reason for unsuccessful TCM follow-up call:  Unable to reach patient

## 2020-11-24 NOTE — Telephone Encounter (Signed)
Transition Care Management Unsuccessful Follow-up Telephone Call  Date of discharge and from where:  11/22/2020  [facility]   Attempts:  2nd Attempt  Reason for unsuccessful TCM follow-up call:  Unable to reach patient

## 2021-05-12 ENCOUNTER — Other Ambulatory Visit: Payer: Self-pay | Admitting: Adult Health

## 2021-05-12 DIAGNOSIS — Z1231 Encounter for screening mammogram for malignant neoplasm of breast: Secondary | ICD-10-CM

## 2021-05-17 ENCOUNTER — Ambulatory Visit
Admission: RE | Admit: 2021-05-17 | Discharge: 2021-05-17 | Disposition: A | Discharge status: Home / Self Care | Payer: Self-pay | Source: Ambulatory Visit

## 2021-05-17 ENCOUNTER — Other Ambulatory Visit: Payer: Self-pay

## 2021-05-17 DIAGNOSIS — Z1231 Encounter for screening mammogram for malignant neoplasm of breast: Secondary | ICD-10-CM

## 2021-05-19 ENCOUNTER — Other Ambulatory Visit: Payer: Self-pay | Admitting: Adult Health

## 2021-05-19 DIAGNOSIS — R928 Other abnormal and inconclusive findings on diagnostic imaging of breast: Secondary | ICD-10-CM

## 2021-06-15 ENCOUNTER — Ambulatory Visit: Payer: No Typology Code available for payment source

## 2021-06-15 ENCOUNTER — Ambulatory Visit
Admission: RE | Admit: 2021-06-15 | Discharge: 2021-06-15 | Disposition: A | Discharge status: Home / Self Care | Payer: No Typology Code available for payment source | Source: Ambulatory Visit | Attending: Adult Health | Admitting: Adult Health

## 2021-06-15 ENCOUNTER — Other Ambulatory Visit: Payer: Self-pay

## 2021-06-15 ENCOUNTER — Other Ambulatory Visit (HOSPITAL_COMMUNITY)
Admission: RE | Admit: 2021-06-15 | Discharge: 2021-06-15 | Disposition: A | Discharge status: Home / Self Care | Payer: No Typology Code available for payment source | Source: Ambulatory Visit | Attending: Radiology | Admitting: Radiology

## 2021-06-15 ENCOUNTER — Encounter: Payer: Self-pay | Admitting: Radiology

## 2021-06-15 ENCOUNTER — Other Ambulatory Visit: Payer: No Typology Code available for payment source

## 2021-06-15 ENCOUNTER — Ambulatory Visit (INDEPENDENT_AMBULATORY_CARE_PROVIDER_SITE_OTHER): Payer: Self-pay | Admitting: Radiology

## 2021-06-15 VITALS — BP 124/82 | Ht 68.0 in | Wt 204.0 lb

## 2021-06-15 DIAGNOSIS — N898 Other specified noninflammatory disorders of vagina: Secondary | ICD-10-CM

## 2021-06-15 DIAGNOSIS — Z01419 Encounter for gynecological examination (general) (routine) without abnormal findings: Secondary | ICD-10-CM

## 2021-06-15 DIAGNOSIS — R928 Other abnormal and inconclusive findings on diagnostic imaging of breast: Secondary | ICD-10-CM

## 2021-06-15 NOTE — Progress Notes (Signed)
? Valari Taylor [facility] May 08, 1975 [id number] ? ? ?History:  46 y.o. African American G2P2 presents for annual exam. C/o vaginal dryness. Not currently sexually active.  ?Was started on testosterone pellets and has noticed she is sleeping better and hot flashes have improved. Had a tummy tuck 8/22 with dehiscence and associated infection. Was out of work 16 weeks. 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 Abnormal screening mammo left, has f/u diagnostic mammo and ultrasound this afternoon. ? ?Gynecologic History ?Patient's last menstrual period was 05/29/2021 (exact date). ?Period Pattern: (!) Irregular ?Contraception/Family planning: coitus interruptus ?Sexually active: yes ?Last Pap: 2016. Results were: normal ?Last mammogram: 2/23. Results were: abnormal, left breast needs further imagine ?Colonoscopy 05/11/16 ? ?Obstetric History ?OB History  ?Gravida Para Term Preterm AB Living  ?'2 2 2 '$ 0 0 2  ?SAB IAB Ectopic Multiple Live Births  ?0 0 0 0 2  ?  ?# Outcome Date GA Lbr Len/2nd Weight Sex Delivery Anes PTL Lv  ?2 Term 2002 [redacted]w[redacted]d  M    LIV  ?1 Term 1994 [id number] F Vag-Spont None  LIV  ? ? ? ?The following portions of the patient's history were reviewed and updated as appropriate: allergies, current medications, past family history, past medical history, past social history, past surgical history, and problem list. ? ?Review of Systems ?Pertinent items noted in HPI and remainder of comprehensive ROS otherwise negative.  ? ?Past medical history, past surgical history, family history and social history were all reviewed and documented in the EPIC chart. ? ? ?Exam: ? ?Vitals:  ? 06/15/21 1059  ?BP: 124/82  ?Weight: 204 lb (92.5 kg)  ?Height: '5\' 8"'$  (1.727 m)  ? ?Body mass index is 31.02 kg/m?. ? ?General appearance:  Normal ?Thyroid:  Symmetrical, normal in size, without palpable masses or nodularity. ?Respiratory ? Auscultation:  Clear without wheezing or rhonchi ?Cardiovascular ? Auscultation:  Regular rate,  without rubs, murmurs or gallops ? Edema/varicosities:  Not grossly evident ?Abdominal ? Soft,nontender, without masses, guarding or rebound. Scarring present from abdominoplasty ? Liver/spleen:  No organomegaly noted ? Hernia:  None appreciated ? Skin ? Inspection:  Grossly normal ?Breasts: Examined lying and sitting.  ? Right: Without masses, retractions, nipple discharge or axillary adenopathy. ? ? Left: Without masses, retractions, nipple discharge or axillary adenopathy. ?Genitourinary  ? Inguinal/mons:  Normal without inguinal adenopathy ? External genitalia:  Normal appearing vulva with no masses, tenderness, or lesions ? BUS/Urethra/Skene's glands:  Normal without masses or exudate ? Vagina:  Normal appearing with normal color and discharge, no lesions, mild atrophy ? Cervix:  Normal appearing without discharge or lesions ? Uterus:  Normal in size, shape and contour.  Mobile, nontender ? Adnexa/parametria:   ?  Rt: Normal in size, without masses or tenderness. ?  Lt: Normal in size, without masses or tenderness. ? Anus and perineum: Normal ?  ?Patient informed chaperone available to be present for breast and pelvic exam. Patient has requested no chaperone to be present. Patient has been advised what will be completed during breast and pelvic exam.  ? ?Assessment/Plan:   ? ?1. Well woman exam with routine gynecological exam ?Pap with cotesting ?Abrnomal mammo- diagnostic mammo today ? ?2. Vaginal dryness ?Will try vit E suppositories  ?If no improvement will consider estrogen therapy  ? ?Discussed SBE, colonoscopy and DEXA screening as directed/appropriate. Recommend 15055m of exercise weekly, including weight bearing exercise. Encouraged the use of seatbelts and sunscreen. ?Return in 1 year for annual  or as needed.  ? Kerry Dory WHNP-BC 11:33 AM 06/15/2021  ?

## 2021-06-20 LAB — CYTOLOGY - PAP
Comment: NEGATIVE
Diagnosis: NEGATIVE
High risk HPV: NEGATIVE

## 2021-09-06 ENCOUNTER — Other Ambulatory Visit: Payer: Self-pay | Admitting: Radiology

## 2021-09-06 MED ORDER — ESTRADIOL 10 MCG VA TABS: 1.0000 | ORAL_TABLET | VAGINAL | 4 refills | Status: AC

## 2021-09-06 NOTE — Progress Notes (Signed)
Patient sent message requesting to try vaginal estrogen for dryness.Rx sent to pharmacy of choice with instructions.

## 2022-07-31 IMAGING — CT CT ABD-PELV W/ CM
2 of 6 series · 15 of 46 positions shown, 17 images · IV contrast (APPLIED)
Comparison: Ultrasound from earlier in the same day.

CLINICAL DATA: History of tummy tuck 3 weeks ago with persistent
pain and swelling in the surgical site as well as new right upper
quadrant and chest pain.

EXAM:
CT ANGIOGRAPHY CHEST
CT ABDOMEN AND PELVIS WITH CONTRAST
TECHNIQUE: Multidetector CT imaging of the chest was performed using the
standard protocol during bolus administration of intravenous
contrast. Multiplanar CT image reconstructions and MIPs were
obtained to evaluate the vascular anatomy. Multidetector CT imaging
of the abdomen and pelvis was performed using the standard protocol
during bolus administration of intravenous contrast.
CONTRAST:  100mL OMNIPAQUE IOHEXOL 350 MG/ML SOLN

[Series 6: a/p_ax · axial · 0.83mm/px · z∈[+1125,+1519]mm · 12 of 229 slices shown, 14 images]
[im 16/229  soft-tissue]
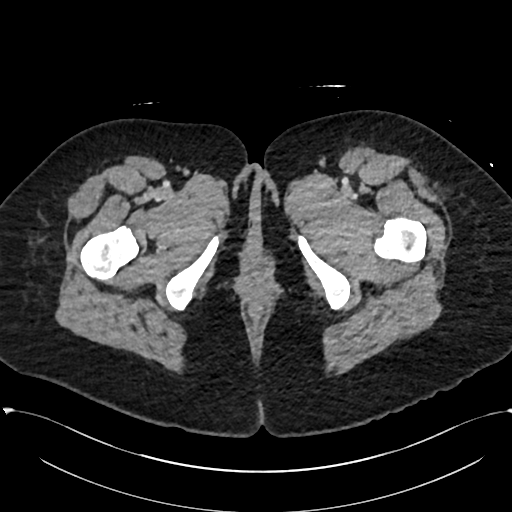
[im 16/229  bone]
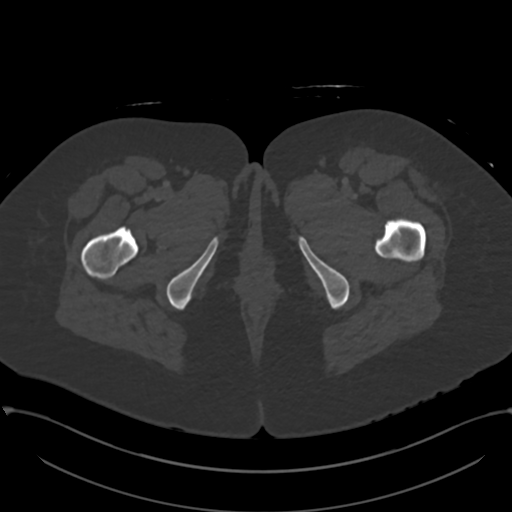
[im 31/229  soft-tissue]
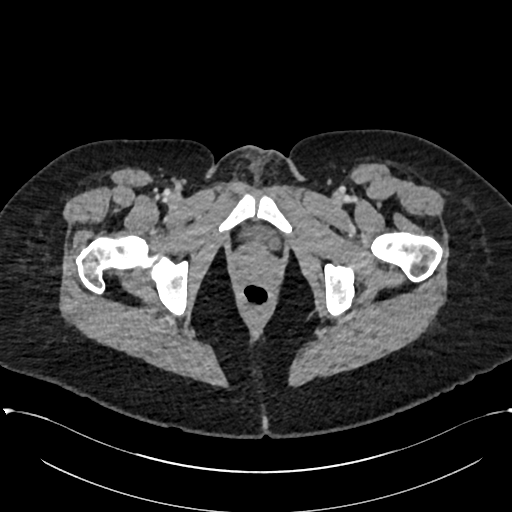
[im 46/229  soft-tissue]
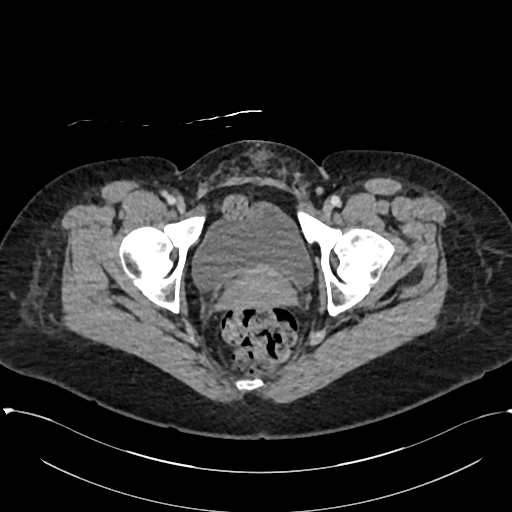
[im 77/229  soft-tissue]
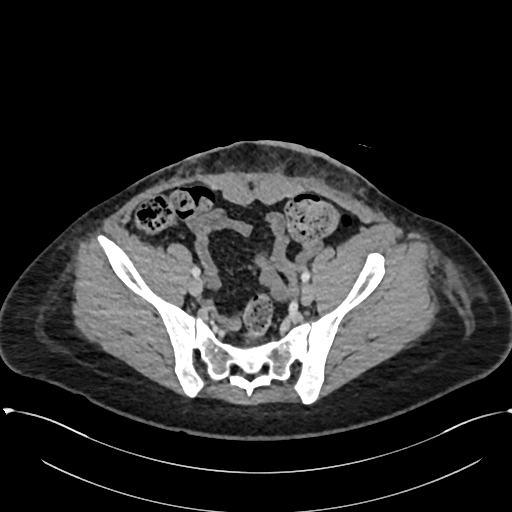
[im 92/229  soft-tissue]
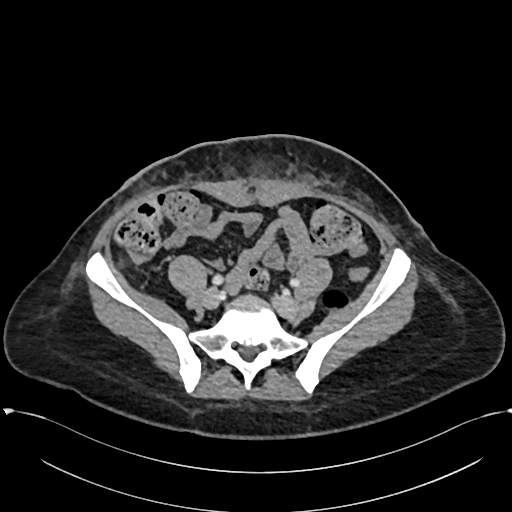
[im 107/229  soft-tissue]
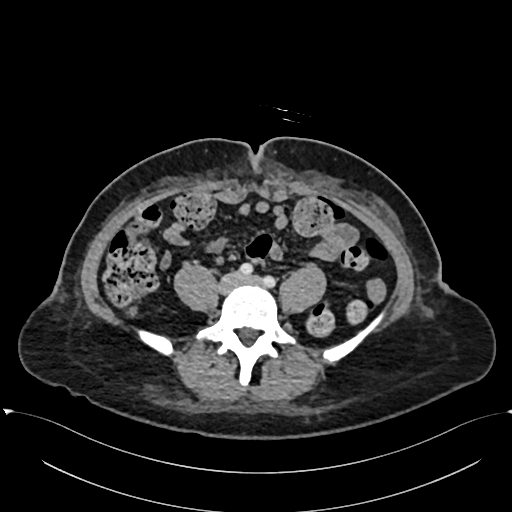
[im 122/229  soft-tissue]
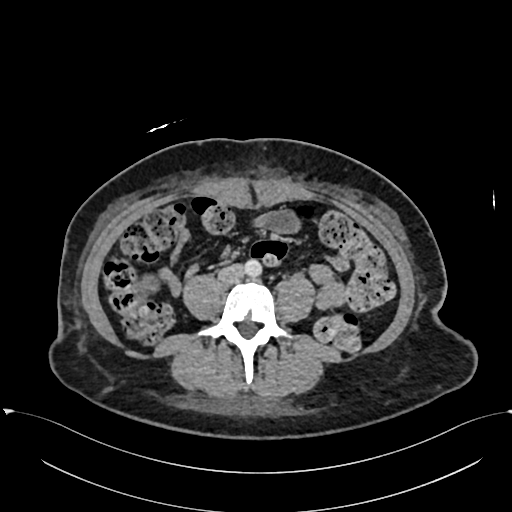
[im 137/229  soft-tissue]
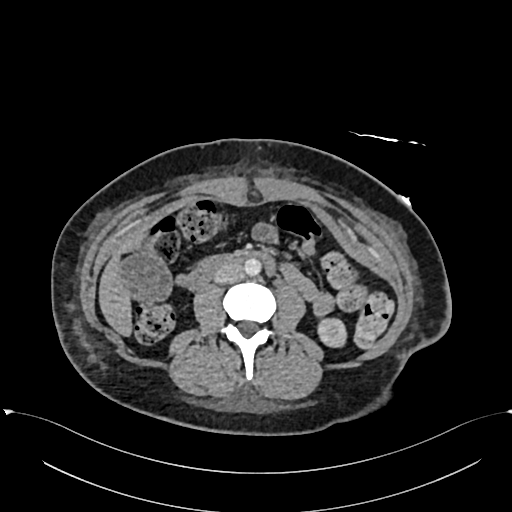
[im 153/229  soft-tissue]
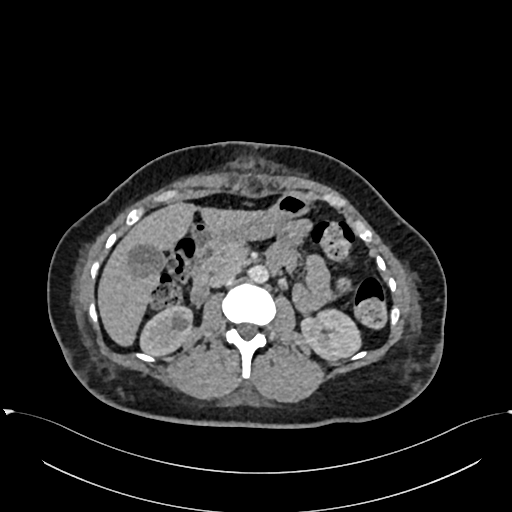
[im 153/229  bone]
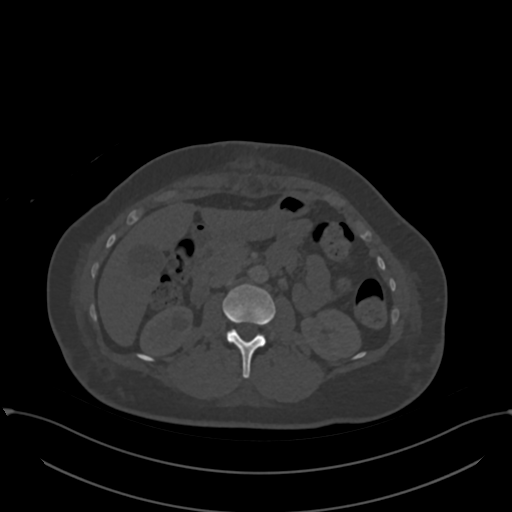
[im 183/229  soft-tissue]
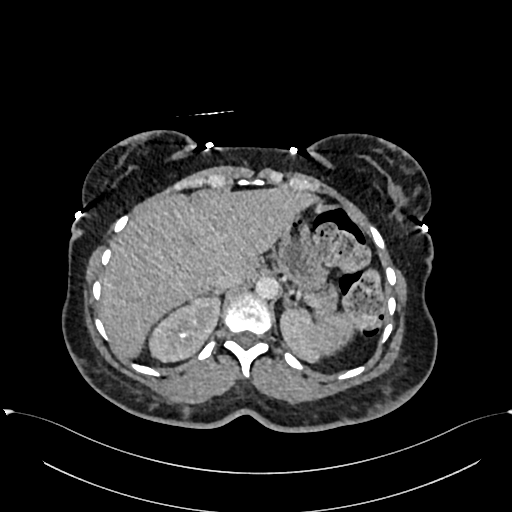
[im 198/229  soft-tissue]
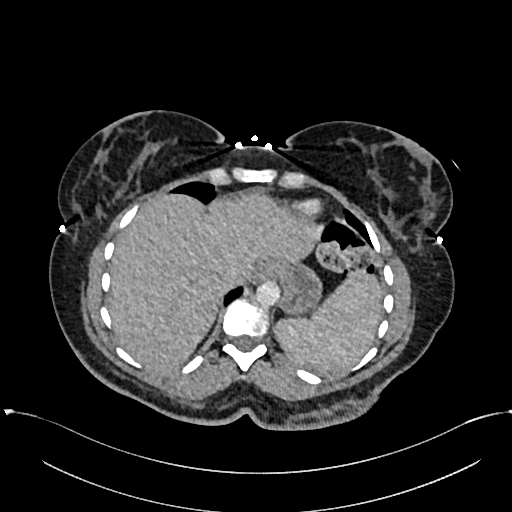
[im 213/229  soft-tissue]
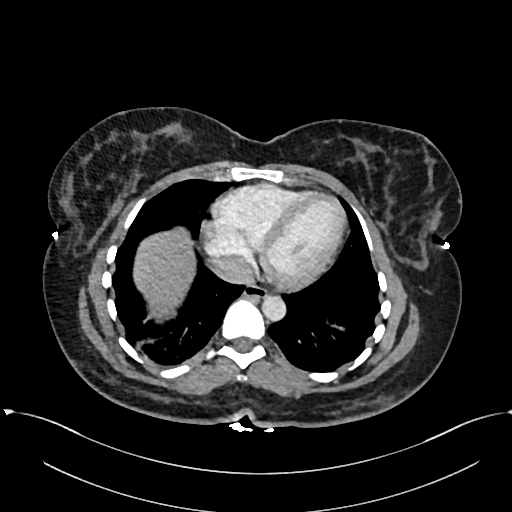

[Series 9: a/p_cor · coronal · 0.89mm/px · 3 of 145 slices shown]
[im 49/145  soft-tissue]
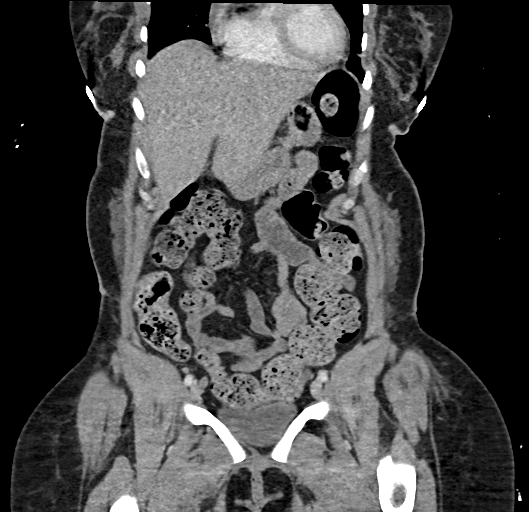
[im 65/145  soft-tissue]
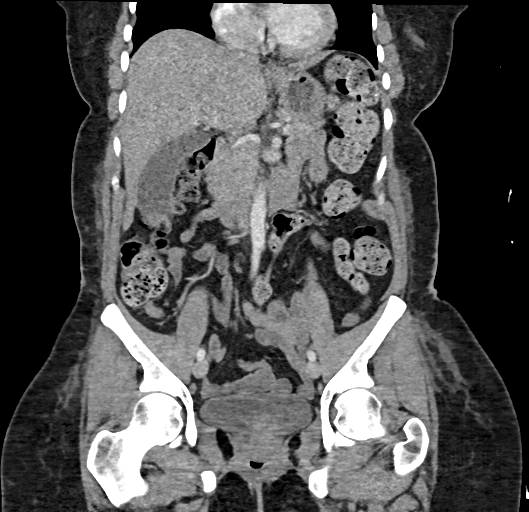
[im 81/145  soft-tissue]
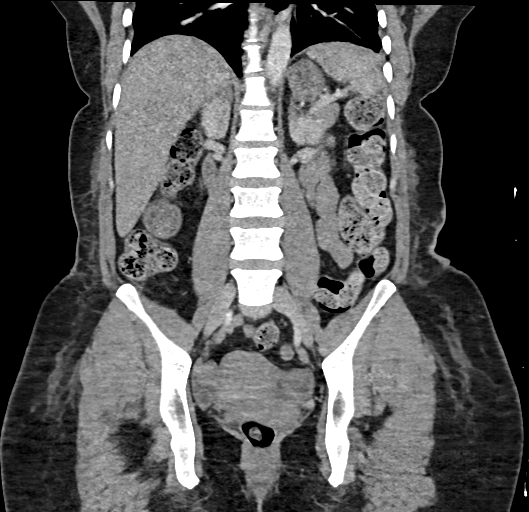

[15 of 46 positions shown; findings below may reference images not displayed]

FINDINGS: CTA CHEST FINDINGS

Cardiovascular: Thoracic aorta and its branches demonstrate a normal
branching pattern. No aneurysmal dilatation or dissection is noted.
No cardiac enlargement is seen. No pericardial effusion is noted. No
coronary calcifications are seen. The pulmonary artery shows a
normal branching pattern without definitive filling defect to
suggest pulmonary embolism.

Mediastinum/Nodes: Thoracic inlet is within normal limits. No
sizable hilar or mediastinal adenopathy is noted. The esophagus as
visualized is within normal limits.

Lungs/Pleura: Mild dependent atelectatic changes are noted in the
right lower lobe. No focal infiltrate or effusion is seen.

Musculoskeletal: No chest wall abnormality. No acute or significant
osseous findings.

Review of the MIP images confirms the above findings.

CT ABDOMEN and PELVIS FINDINGS

Hepatobiliary: Liver is within normal limits. Changes consistent
with cholelithiasis are seen. No biliary ductal dilatation is noted.

Pancreas: Unremarkable. No pancreatic ductal dilatation or
surrounding inflammatory changes.

Spleen: Normal in size without focal abnormality.

Adrenals/Urinary Tract: Adrenal glands are within normal limits. The
kidneys demonstrate early excretion of contrast material. No
obstructive changes are noted. The bladder is partially distended.

Stomach/Bowel: Scattered fecal material is noted throughout the
colon without obstructive change or significant constipation. The
appendix is within normal limits. No small bowel or gastric
abnormality is seen.

Vascular/Lymphatic: No significant vascular findings are present. No
enlarged abdominal or pelvic lymph nodes.

Reproductive: Uterus and bilateral adnexa are unremarkable.

Other: No free fluid is noted. There are changes in the anterior
abdominal wall with subcutaneous edema consistent with the history
of recent tummy tuck procedure. No focal fluid collection to suggest
abscess formation is noted. Mild skin thickening is noted consistent
with a degree of cellulitis.

Musculoskeletal: No acute or significant osseous findings.

Review of the MIP images confirms the above findings.
IMPRESSION: CTA of the chest: No definitive pulmonary emboli are seen.

Mild right basilar atelectasis.

CT of the abdomen and pelvis: Cholelithiasis without complicating
factors.

Mild retained fecal material without constipation.

Changes consistent with the given clinical history of recent tummy
tuck procedure with subcutaneous edema and mild skin thickening
likely representing cellulitis. No focal abscess is noted.

## 2022-07-31 IMAGING — CT CT ANGIO CHEST
2 of 6 series · 16 of 46 positions shown · IV contrast (APPLIED)
Comparison: Ultrasound from earlier in the same day.

CLINICAL DATA: History of tummy tuck 3 weeks ago with persistent
pain and swelling in the surgical site as well as new right upper
quadrant and chest pain.

EXAM:
CT ANGIOGRAPHY CHEST
CT ABDOMEN AND PELVIS WITH CONTRAST
TECHNIQUE: Multidetector CT imaging of the chest was performed using the
standard protocol during bolus administration of intravenous
contrast. Multiplanar CT image reconstructions and MIPs were
obtained to evaluate the vascular anatomy. Multidetector CT imaging
of the abdomen and pelvis was performed using the standard protocol
during bolus administration of intravenous contrast.
CONTRAST:  100mL OMNIPAQUE IOHEXOL 350 MG/ML SOLN

[Series 7: thins · axial · 0.66mm/px · z∈[+1473,+1701]mm · 13 of 358 slices shown]
[im 16/358  lung]
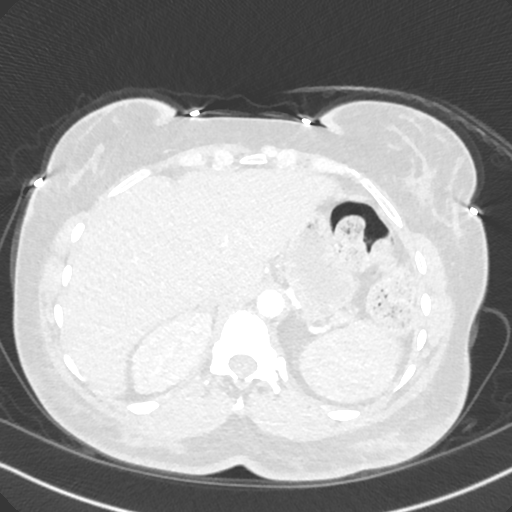
[im 47/358  soft-tissue]
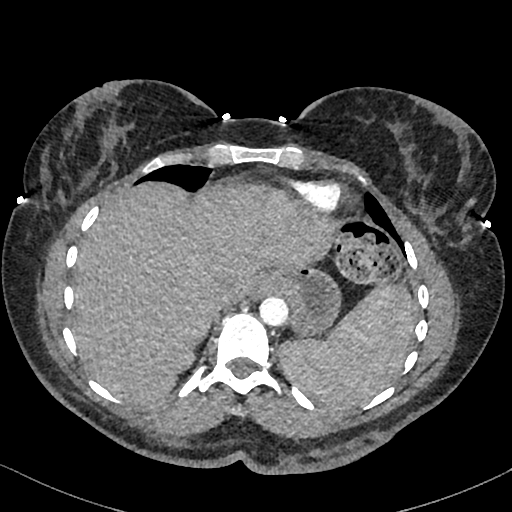
[im 78/358  lung]
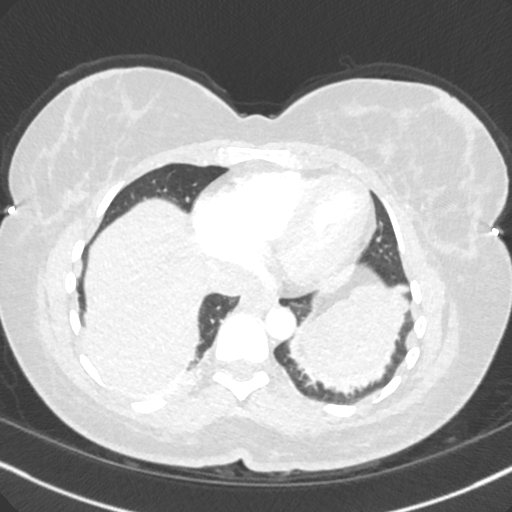
[im 94/358  soft-tissue]
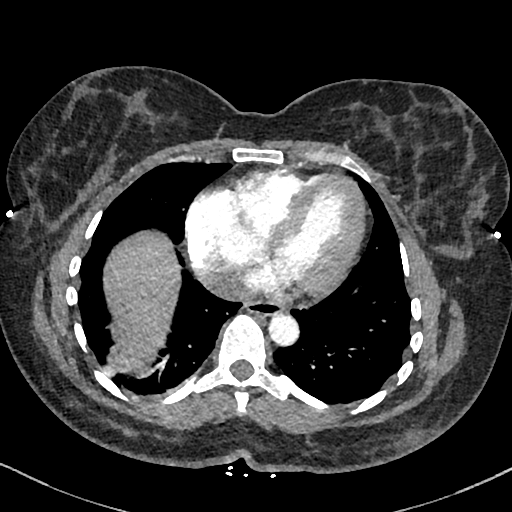
[im 125/358  lung]
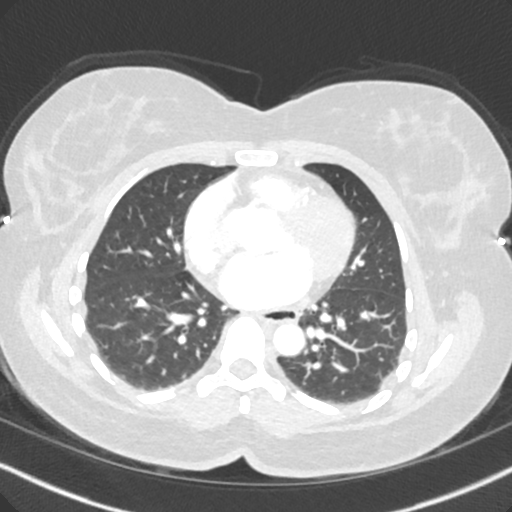
[im 156/358  soft-tissue]
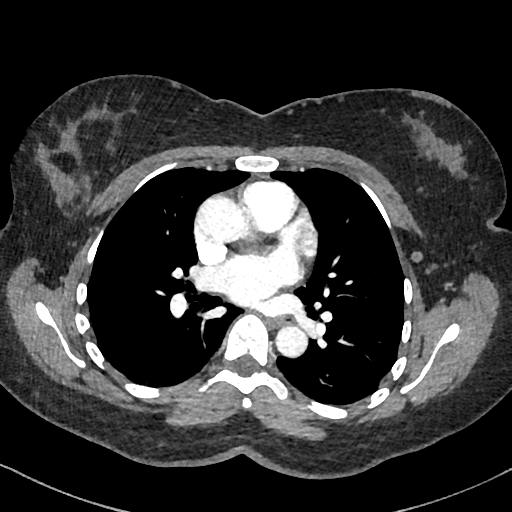
[im 187/358  lung]
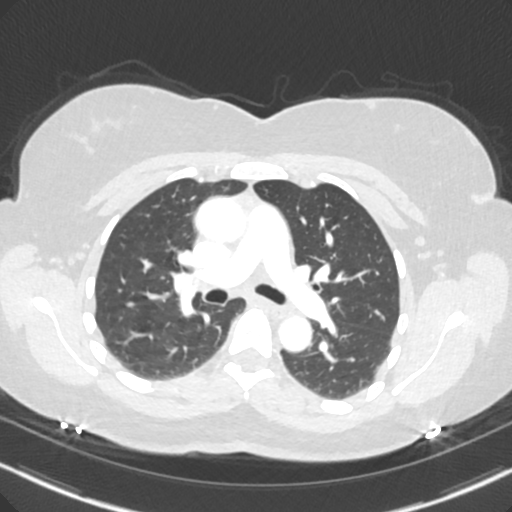
[im 202/358  soft-tissue]
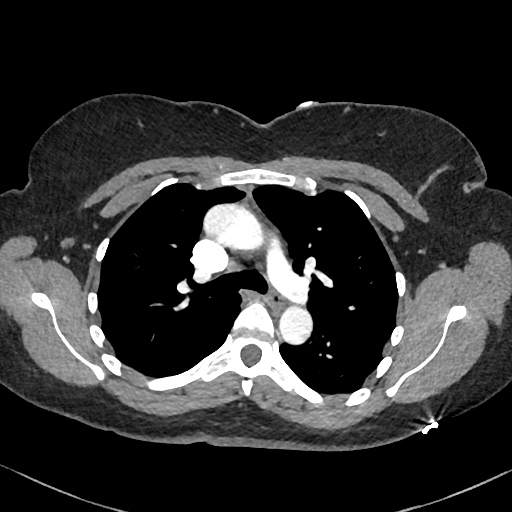
[im 233/358  lung]
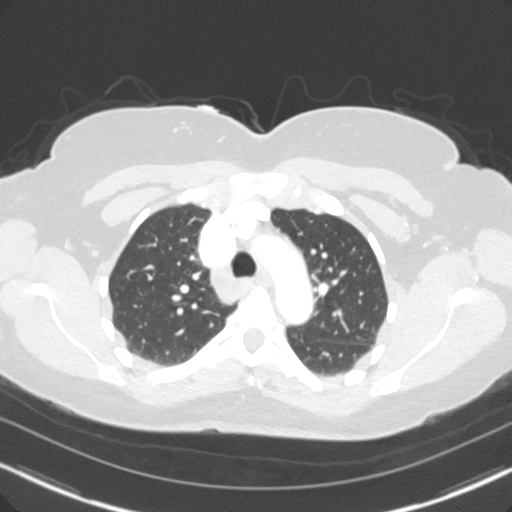
[im 264/358  soft-tissue]
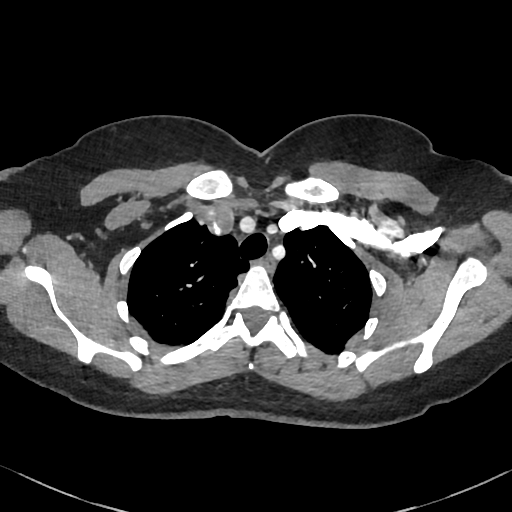
[im 280/358  lung]
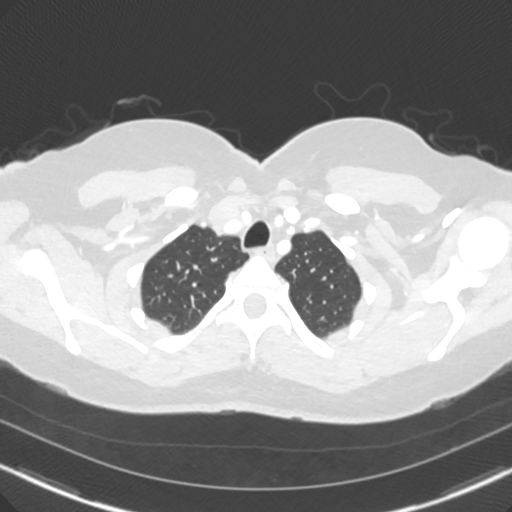
[im 311/358  soft-tissue]
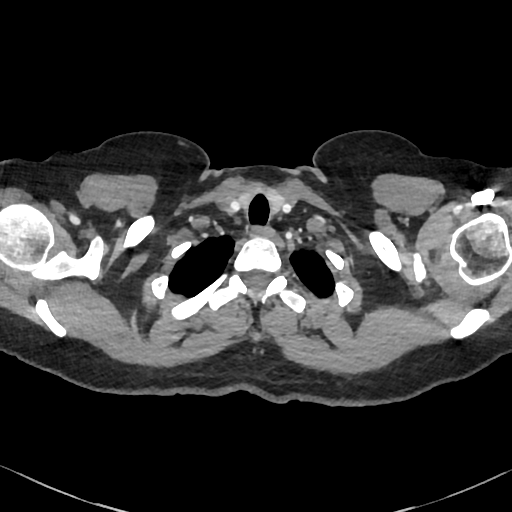
[im 342/358  lung]
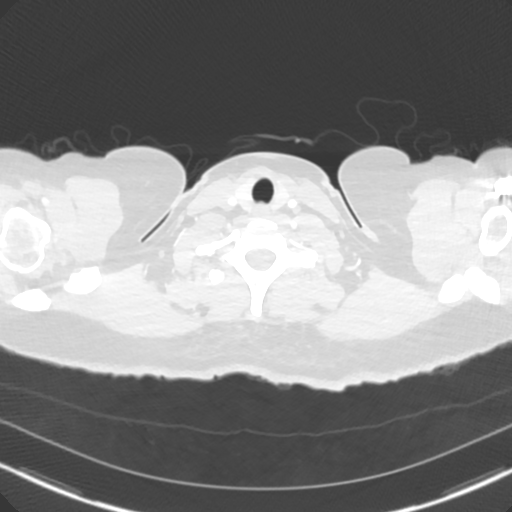

[Series 8: cor · coronal · 0.51mm/px · 3 of 130 slices shown]
[im 33/130  soft-tissue]
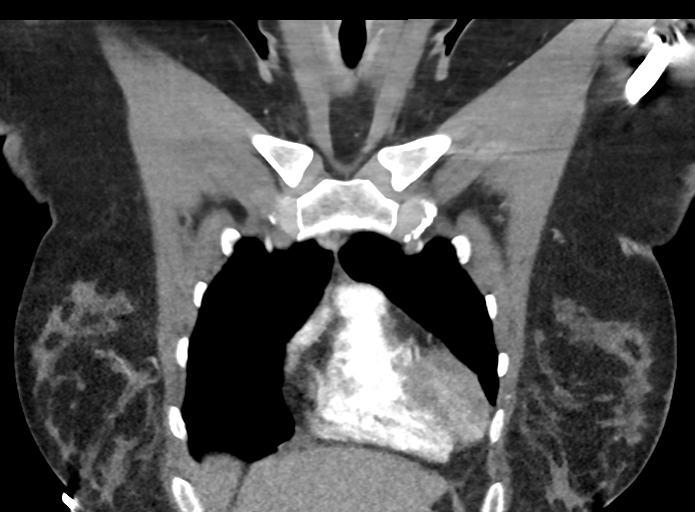
[im 65/130  soft-tissue]
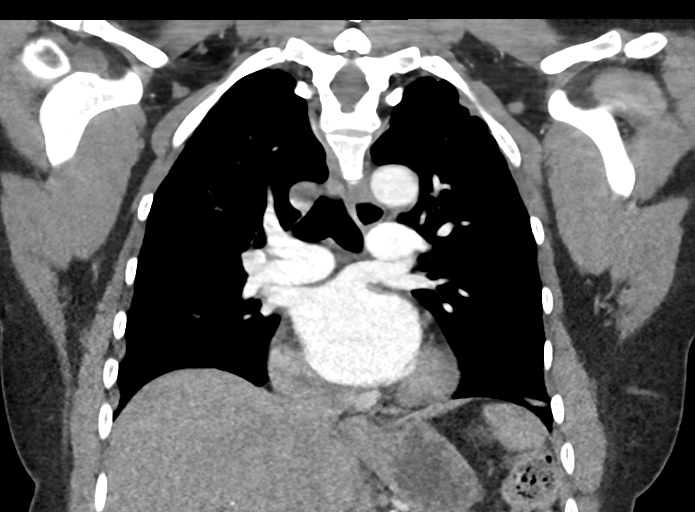
[im 97/130  soft-tissue]
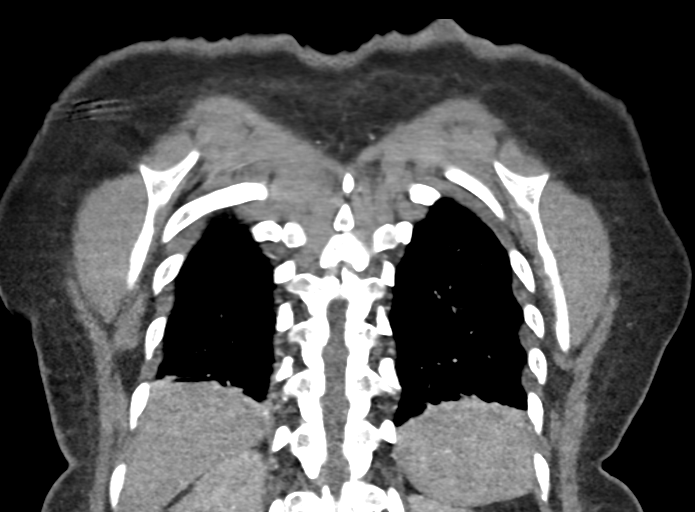

[16 of 46 positions shown; findings below may reference images not displayed]

FINDINGS: CTA CHEST FINDINGS

Cardiovascular: Thoracic aorta and its branches demonstrate a normal
branching pattern. No aneurysmal dilatation or dissection is noted.
No cardiac enlargement is seen. No pericardial effusion is noted. No
coronary calcifications are seen. The pulmonary artery shows a
normal branching pattern without definitive filling defect to
suggest pulmonary embolism.

Mediastinum/Nodes: Thoracic inlet is within normal limits. No
sizable hilar or mediastinal adenopathy is noted. The esophagus as
visualized is within normal limits.

Lungs/Pleura: Mild dependent atelectatic changes are noted in the
right lower lobe. No focal infiltrate or effusion is seen.

Musculoskeletal: No chest wall abnormality. No acute or significant
osseous findings.

Review of the MIP images confirms the above findings.

CT ABDOMEN and PELVIS FINDINGS

Hepatobiliary: Liver is within normal limits. Changes consistent
with cholelithiasis are seen. No biliary ductal dilatation is noted.

Pancreas: Unremarkable. No pancreatic ductal dilatation or
surrounding inflammatory changes.

Spleen: Normal in size without focal abnormality.

Adrenals/Urinary Tract: Adrenal glands are within normal limits. The
kidneys demonstrate early excretion of contrast material. No
obstructive changes are noted. The bladder is partially distended.

Stomach/Bowel: Scattered fecal material is noted throughout the
colon without obstructive change or significant constipation. The
appendix is within normal limits. No small bowel or gastric
abnormality is seen.

Vascular/Lymphatic: No significant vascular findings are present. No
enlarged abdominal or pelvic lymph nodes.

Reproductive: Uterus and bilateral adnexa are unremarkable.

Other: No free fluid is noted. There are changes in the anterior
abdominal wall with subcutaneous edema consistent with the history
of recent tummy tuck procedure. No focal fluid collection to suggest
abscess formation is noted. Mild skin thickening is noted consistent
with a degree of cellulitis.

Musculoskeletal: No acute or significant osseous findings.

Review of the MIP images confirms the above findings.
IMPRESSION: CTA of the chest: No definitive pulmonary emboli are seen.

Mild right basilar atelectasis.

CT of the abdomen and pelvis: Cholelithiasis without complicating
factors.

Mild retained fecal material without constipation.

Changes consistent with the given clinical history of recent tummy
tuck procedure with subcutaneous edema and mild skin thickening
likely representing cellulitis. No focal abscess is noted.
